# Patient Record
Sex: Male | Born: 1950 | Hispanic: No | Marital: Married | Smoking: Former smoker
Health system: Southern US, Community
[De-identification: ages and names within clinical notes are randomized; demographics above are authoritative.]

## PROBLEM LIST (undated history)

## (undated) DIAGNOSIS — E782 Mixed hyperlipidemia: Secondary | ICD-10-CM

## (undated) DIAGNOSIS — K219 Gastro-esophageal reflux disease without esophagitis: Secondary | ICD-10-CM

## (undated) DIAGNOSIS — T782XXA Anaphylactic shock, unspecified, initial encounter: Secondary | ICD-10-CM

## (undated) DIAGNOSIS — M1990 Unspecified osteoarthritis, unspecified site: Secondary | ICD-10-CM

## (undated) DIAGNOSIS — D841 Defects in the complement system: Secondary | ICD-10-CM

## (undated) DIAGNOSIS — R931 Abnormal findings on diagnostic imaging of heart and coronary circulation: Secondary | ICD-10-CM

## (undated) DIAGNOSIS — K4090 Unilateral inguinal hernia, without obstruction or gangrene, not specified as recurrent: Secondary | ICD-10-CM

## (undated) DIAGNOSIS — T63481A Toxic effect of venom of other arthropod, accidental (unintentional), initial encounter: Secondary | ICD-10-CM

## (undated) DIAGNOSIS — R351 Nocturia: Secondary | ICD-10-CM

## (undated) HISTORY — PX: NO PAST SURGERIES: SHX2092

---

## 2007-01-12 ENCOUNTER — Emergency Department (HOSPITAL_COMMUNITY): Admission: EM | Admit: 2007-01-12 | Discharge: 2007-01-12 | Payer: Self-pay | Admitting: Emergency Medicine

## 2015-12-01 ENCOUNTER — Emergency Department (HOSPITAL_BASED_OUTPATIENT_CLINIC_OR_DEPARTMENT_OTHER): Payer: BC Managed Care – PPO

## 2015-12-01 ENCOUNTER — Observation Stay (HOSPITAL_BASED_OUTPATIENT_CLINIC_OR_DEPARTMENT_OTHER)
Admission: EM | Admit: 2015-12-01 | Discharge: 2015-12-02 | Discharge status: Against Medical Advice | Payer: BC Managed Care – PPO | Attending: Internal Medicine | Admitting: Internal Medicine

## 2015-12-01 ENCOUNTER — Encounter (HOSPITAL_BASED_OUTPATIENT_CLINIC_OR_DEPARTMENT_OTHER): Payer: Self-pay | Admitting: *Deleted

## 2015-12-01 DIAGNOSIS — Z87891 Personal history of nicotine dependence: Secondary | ICD-10-CM | POA: Insufficient documentation

## 2015-12-01 DIAGNOSIS — T783XXA Angioneurotic edema, initial encounter: Secondary | ICD-10-CM | POA: Diagnosis not present

## 2015-12-01 DIAGNOSIS — J0510 Acute epiglottitis without obstruction: Secondary | ICD-10-CM | POA: Diagnosis not present

## 2015-12-01 DIAGNOSIS — T7840XA Allergy, unspecified, initial encounter: Secondary | ICD-10-CM | POA: Diagnosis present

## 2015-12-01 LAB — CBC WITH DIFFERENTIAL/PLATELET
Basophils Absolute: 0 10*3/uL (ref 0.0–0.1)
Basophils Relative: 0 %
EOS PCT: 6 %
Eosinophils Absolute: 0.5 10*3/uL (ref 0.0–0.7)
HCT: 45.1 % (ref 39.0–52.0)
HEMOGLOBIN: 15.5 g/dL (ref 13.0–17.0)
LYMPHS ABS: 3.9 10*3/uL (ref 0.7–4.0)
LYMPHS PCT: 44 %
MCH: 30.5 pg (ref 26.0–34.0)
MCHC: 34.4 g/dL (ref 30.0–36.0)
MCV: 88.8 fL (ref 78.0–100.0)
MONOS PCT: 9 %
Monocytes Absolute: 0.8 10*3/uL (ref 0.1–1.0)
Neutro Abs: 3.7 10*3/uL (ref 1.7–7.7)
Neutrophils Relative %: 41 %
PLATELETS: 148 10*3/uL — AB (ref 150–400)
RBC: 5.08 MIL/uL (ref 4.22–5.81)
RDW: 12.8 % (ref 11.5–15.5)
WBC: 8.9 10*3/uL (ref 4.0–10.5)

## 2015-12-01 LAB — BASIC METABOLIC PANEL
Anion gap: 9 (ref 5–15)
BUN: 17 mg/dL (ref 6–20)
CHLORIDE: 103 mmol/L (ref 101–111)
CO2: 29 mmol/L (ref 22–32)
Calcium: 9 mg/dL (ref 8.9–10.3)
Creatinine, Ser: 0.96 mg/dL (ref 0.61–1.24)
GFR calc Af Amer: >60 mL/min (ref >60)
GFR calc non Af Amer: >60 mL/min (ref >60)
GLUCOSE: 116 mg/dL — AB (ref 65–99)
POTASSIUM: 3.5 mmol/L (ref 3.5–5.1)
Sodium: 141 mmol/L (ref 135–145)

## 2015-12-01 LAB — MRSA PCR SCREENING: MRSA by PCR: NEGATIVE

## 2015-12-01 MED ORDER — CLINDAMYCIN PHOSPHATE 600 MG/50ML IV SOLN
600.0000 mg | Freq: Once | INTRAVENOUS | Status: AC
  Administered 2015-12-01: 600 mg via INTRAVENOUS
  Filled 2015-12-01: qty 50

## 2015-12-01 MED ORDER — DIPHENHYDRAMINE HCL 50 MG/ML IJ SOLN
25.0000 mg | Freq: Two times a day (BID) | INTRAMUSCULAR | Status: DC
  Administered 2015-12-01 – 2015-12-02 (×3): 25 mg via INTRAVENOUS
  Filled 2015-12-01 (×3): qty 1

## 2015-12-01 MED ORDER — FAMOTIDINE IN NACL 20-0.9 MG/50ML-% IV SOLN
20.0000 mg | Freq: Once | INTRAVENOUS | Status: AC
  Administered 2015-12-01: 20 mg via INTRAVENOUS
  Filled 2015-12-01: qty 50

## 2015-12-01 MED ORDER — FAMOTIDINE IN NACL 20-0.9 MG/50ML-% IV SOLN
20.0000 mg | INTRAVENOUS | Status: DC
  Administered 2015-12-01 – 2015-12-02 (×2): 20 mg via INTRAVENOUS
  Filled 2015-12-01 (×2): qty 50

## 2015-12-01 MED ORDER — DIPHENHYDRAMINE HCL 50 MG/ML IJ SOLN
25.0000 mg | Freq: Once | INTRAMUSCULAR | Status: AC
  Administered 2015-12-01: 25 mg via INTRAVENOUS
  Filled 2015-12-01: qty 1

## 2015-12-01 MED ORDER — ACETAMINOPHEN 325 MG PO TABS: 650.0000 mg | ORAL_TABLET | Freq: Four times a day (QID) | ORAL | Status: DC | PRN

## 2015-12-01 MED ORDER — SODIUM CHLORIDE 0.9 % IV BOLUS (SEPSIS)
500.0000 mL | Freq: Once | INTRAVENOUS | Status: AC
  Administered 2015-12-01: 500 mL via INTRAVENOUS

## 2015-12-01 MED ORDER — METHYLPREDNISOLONE SODIUM SUCC 125 MG IJ SOLR
125.0000 mg | Freq: Once | INTRAMUSCULAR | Status: AC
  Administered 2015-12-01: 125 mg via INTRAVENOUS
  Filled 2015-12-01: qty 2

## 2015-12-01 MED ORDER — SODIUM CHLORIDE 0.9 % IV SOLN
INTRAVENOUS | Status: DC
  Administered 2015-12-01 – 2015-12-02 (×2): via INTRAVENOUS

## 2015-12-01 MED ORDER — RISAQUAD PO CAPS
2.0000 | ORAL_CAPSULE | Freq: Every day | ORAL | Status: DC
  Administered 2015-12-01: 2 via ORAL
  Filled 2015-12-01 (×3): qty 2

## 2015-12-01 MED ORDER — METHYLPREDNISOLONE SODIUM SUCC 125 MG IJ SOLR
60.0000 mg | Freq: Three times a day (TID) | INTRAMUSCULAR | Status: DC
Start: 2015-12-01 — End: 2015-12-01

## 2015-12-01 MED ORDER — METHYLPREDNISOLONE SODIUM SUCC 125 MG IJ SOLR: 80.0000 mg | Freq: Four times a day (QID) | INTRAMUSCULAR | Status: DC

## 2015-12-01 MED ORDER — SODIUM CHLORIDE 0.9 % IV SOLN
INTRAVENOUS | Status: DC
  Administered 2015-12-01: 1000 mL via INTRAVENOUS

## 2015-12-01 MED ORDER — DEXAMETHASONE SODIUM PHOSPHATE 10 MG/ML IJ SOLN
10.0000 mg | Freq: Three times a day (TID) | INTRAMUSCULAR | Status: DC
  Administered 2015-12-01 – 2015-12-02 (×3): 10 mg via INTRAVENOUS
  Filled 2015-12-01 (×4): qty 1

## 2015-12-01 MED ORDER — ACETAMINOPHEN 650 MG RE SUPP: 650.0000 mg | Freq: Four times a day (QID) | RECTAL | Status: DC | PRN

## 2015-12-01 MED ORDER — CLINDAMYCIN PHOSPHATE 600 MG/50ML IV SOLN
600.0000 mg | Freq: Four times a day (QID) | INTRAVENOUS | Status: DC
  Administered 2015-12-01 – 2015-12-02 (×4): 600 mg via INTRAVENOUS
  Filled 2015-12-01 (×6): qty 50

## 2015-12-01 MED ORDER — CLINDAMYCIN PHOSPHATE 300 MG/2ML IJ SOLN
INTRAMUSCULAR | Status: AC
  Filled 2015-12-01: qty 4

## 2015-12-01 NOTE — ED Provider Notes (Signed)
CSN: [id number]     Arrival date & time 12/01/15  0150 History   First MD Initiated Contact with Patient 12/01/15 0151     Chief Complaint  Patient presents with  . Allergic Reaction     (Consider location/radiation/quality/duration/timing/severity/associated sxs/prior Treatment) Patient is a 65 y.o. male presenting with allergic reaction. The history is provided by the patient.  Allergic Reaction Presenting symptoms: no itching, no rash and no wheezing   Severity:  Moderate Prior allergic episodes:  Unable to specify (has happened in the past and has an epi pen though all allergy testing in past was negative) Context: no animal exposure, no food allergies, no grass, no medications, no new detergents/soaps and no nuts   Relieved by:  Nothing Worsened by:  Nothing tried Ineffective treatments:  Epinephrine Symptoms of throat swelling that started at 3 pm.  Continued throughout the evening and used son's epi pen at 1 am and then not improved so came to the ED.  No rashes on the skin has had this before and states has had allergy testing that was negative. Cant' elicit an inciting event or foot.  No f/c/r.  No rashes on the skin.  No nasal congestion no sore throat.  Has never been seen in the ED for this reaction [place] and was given RX for epi pen by PMD.  His epi pen is expired.    History reviewed. No pertinent past medical history. History reviewed. No pertinent past surgical history. History reviewed. No pertinent family history. Social History  Substance Use Topics  . Smoking status: Former Research scientist (life sciences)  . Smokeless tobacco: None  . Alcohol Use: Yes    Review of Systems  Constitutional: Negative for fever.  HENT: Negative for congestion, drooling, postnasal drip, sore throat and voice change.   Respiratory: Negative for shortness of breath, wheezing and stridor.   Skin: Negative for itching and rash.  All other systems reviewed and are  negative.     Allergies  Review of patient's allergies indicates no known allergies.  Home Medications   Prior to Admission medications   Not on File   BP 151/83 mmHg  Pulse 53  Temp(Src) 97.7 F (36.5 C) (Oral)  Resp 8  Ht 5\' 6"  (1.676 m)  Wt 157 lb (71.215 kg)  BMI 25.35 kg/m2  SpO2 99% Physical Exam  Constitutional: He is oriented to person, place, and time. He appears well-developed and well-nourished. No distress.  HENT:  Head: Normocephalic and atraumatic.  Mouth/Throat: Oropharynx is clear and moist.  No swelling of the lips tongue or uvula  Eyes: Conjunctivae are normal. Pupils are equal, round, and reactive to light.  Neck: Normal range of motion. Neck supple. No tracheal deviation present.  Phonation intact  Cardiovascular: Normal rate, regular rhythm and intact distal pulses.   Pulmonary/Chest: Effort normal and breath sounds normal. No stridor. No respiratory distress. He has no wheezes. He has no rales.  Abdominal: Soft. Bowel sounds are normal. There is no tenderness. There is no rebound and no guarding.  Musculoskeletal: Normal range of motion.  Neurological: He is alert and oriented to person, place, and time.  Skin: Skin is warm and dry. He is not diaphoretic.  Psychiatric: He has a normal mood and affect.    ED Course  Procedures (including critical care time) Labs Review Labs Reviewed  CBC WITH DIFFERENTIAL/PLATELET - Abnormal; Notable for the following:    Platelets 148 (*)    All other  components within normal limits  BASIC METABOLIC PANEL - Abnormal; Notable for the following:    Glucose, Bld 116 (*)    All other components within normal limits    Imaging Review No results found. I have personally reviewed and evaluated these images and lab results as part of my medical decision-making.   EKG Interpretation None      MDM   Final diagnoses:  None    Medications  methylPREDNISolone sodium succinate (SOLU-MEDROL) 125 mg/2 mL  injection 125 mg (125 mg Intravenous Given 12/01/15 0217)  famotidine (PEPCID) IVPB 20 mg premix (0 mg Intravenous Stopped 12/01/15 0245)  diphenhydrAMINE (BENADRYL) injection 25 mg (25 mg Intravenous Given 12/01/15 0217)  sodium chloride 0.9 % bolus 500 mL (0 mLs Intravenous Stopped 12/01/15 0245)    Patient being observed in the Ed following Epi use   No swelling on repeat exams but patient states he is not any better then stating this will take a couple of days to pass as this is what has happened on previous episodes.  Then states he will need antibiotics to improve this as he has needed this in the past.    EDP states that we do not give antibiotics for allergic reactions.  As there are no signs of allergic reaction at this time EDP planned Xray to ensure no signs of infection.   Patient then refused Xray  EDP spoke with patient and wife at length about Xray.  Now consenting stating he needs the antibiotics for the tightness  439 Case d/w Dr. Benjamine Mola by phone.  Admit to medicine for observation and IV clindamycin.  Will see in consult.    500 am   Results for orders placed or performed during the hospital encounter of 12/01/15  CBC with Differential/Platelet  Result Value Ref Range   WBC 8.9 4.0 - 10.5 K/uL   RBC 5.08 4.22 - 5.81 MIL/uL   Hemoglobin 15.5 13.0 - 17.0 g/dL   HCT 45.1 39.0 - 52.0 %   MCV 88.8 78.0 - 100.0 fL   MCH 30.5 26.0 - 34.0 pg   MCHC 34.4 30.0 - 36.0 g/dL   RDW 12.8 11.5 - 15.5 %   Platelets 148 (L) 150 - 400 K/uL   Neutrophils Relative % 41 %   Neutro Abs 3.7 1.7 - 7.7 K/uL   Lymphocytes Relative 44 %   Lymphs Abs 3.9 0.7 - 4.0 K/uL   Monocytes Relative 9 %   Monocytes Absolute 0.8 0.1 - 1.0 K/uL   Eosinophils Relative 6 %   Eosinophils Absolute 0.5 0.0 - 0.7 K/uL   Basophils Relative 0 %   Basophils Absolute 0.0 0.0 - 0.1 K/uL  Basic metabolic panel  Result Value Ref Range   Sodium 141 135 - 145 mmol/L   Potassium 3.5 3.5 - 5.1 mmol/L   Chloride 103 101  - 111 mmol/L   CO2 29 22 - 32 mmol/L   Glucose, Bld 116 (H) 65 - 99 mg/dL   BUN 17 6 - 20 mg/dL   Creatinine, Ser 0.96 0.61 - 1.24 mg/dL   Calcium 9.0 8.9 - 10.3 mg/dL   GFR calc non Af Amer >60 >60 mL/min   GFR calc Af Amer >60 >60 mL/min   Anion gap 9 5 - 15   Dg Neck Soft Tissue  12/01/2015  CLINICAL DATA:  Neck swelling for 8 hours. Possible allergic reaction. EXAM: NECK SOFT TISSUES - 1+ VIEW COMPARISON:  None. FINDINGS: Soft tissue swelling is demonstrated  in the epiglottis and area epiglottic folds with mild prevertebral and submental soft tissue swelling. Changes likely due to inflammation. No soft tissue gas collections. No foreign bodies identified. Airways appear patent. Degenerative changes in the cervical spine. IMPRESSION: Soft tissue swelling in the epiglottis, aryepiglottic folds, submental soft tissues, and prevertebral soft tissues, likely inflammatory. Electronically Signed   By: Lucienne Capers M.D.   On: 12/01/2015 04:26    Medications  clindamycin (CLEOCIN) IVPB 600 mg (not administered)  clindamycin (CLEOCIN) 300 MG/2ML injection (not administered)  0.9 %  sodium chloride infusion (not administered)  methylPREDNISolone sodium succinate (SOLU-MEDROL) 125 mg/2 mL injection 125 mg (125 mg Intravenous Given 12/01/15 0217)  famotidine (PEPCID) IVPB 20 mg premix (0 mg Intravenous Stopped 12/01/15 0245)  diphenhydrAMINE (BENADRYL) injection 25 mg (25 mg Intravenous Given 12/01/15 0217)  sodium chloride 0.9 % bolus 500 mL (0 mLs Intravenous Stopped 12/01/15 0245)    MDM Reviewed: previous chart, nursing note and vitals (normal temp and O2 saturation) Interpretation: labs and x-ray (no elevation of WBC platelets slighlty low at 148  XRay with epiglottitis by me) Total time providing critical care: > 105 minutes. This excludes time spent performing separately reportable procedures and services. Consults: admitting MD ([facility]- [facility])  Reexamined no stridor.  No swelling in the oral  airway  CRITICAL CARE Performed by: Carlisle Beers Total critical care time: 120 minutes Critical care time was exclusive of separately billable procedures and treating other patients. Critical care was necessary to treat or prevent imminent or life-threatening deterioration. Critical care was time spent personally by me on the following activities: development of treatment plan with patient and/or surrogate as well as nursing, discussions with consultants, evaluation of patient's response to treatment, examination of patient, obtaining history from patient or surrogate, ordering and performing treatments and interventions, ordering and review of laboratory studies, ordering and review of radiographic studies, pulse oximetry and re-evaluation of patient's condition.    Veatrice Kells, MD 12/01/15 [phone]

## 2015-12-01 NOTE — Progress Notes (Signed)
This is a no charge note  Transfer from  [facility] per Dr. Randal Buba  65 year old lady with past medical history of allergy, who presents with the allergic reaction yesterday, developed swelling throat. Patient used epi pen at home without improvement. No stridor per EDP.  Blood pressure 151/83, heart rate 53, respiration rate 8, and saturation 99%, WBCs 8.9, electrolytes and renal function okay. X-ray of neck showed soft tissue swelling in the epiglottis, aryepiglottic folds, submental soft tissues, and prevertebral soft tissues, likely inflammatory.  Pt is accepted to SDU. Patient was treated with Benadryl, Pepcid and Solu-Medrol and started clindamycin. ENT. Dr. Benjamine Mola was consulted by EDP. Please let Dr. Benjamine Mola at pt's arrival to floor. Asked Dr. Dorina Hoyer to evaluate pt again at time just before being transferred. She agreed to do so.  Ivor Costa, MD  [facility] Pager [phone]  If 7PM-7AM, please contact night-coverage [email or web address] Password [email or web address] 12/01/2015, 6:06 AM

## 2015-12-01 NOTE — ED Notes (Addendum)
States had allergic reaction onset yesterday afternoon, this am flet like throat was swelling  Used epi pen prior to coming to er  No distress noted

## 2015-12-01 NOTE — ED Notes (Signed)
Started have a "reaction " yesterday pm , throat was swelling,   Took ibu and tums,   Used epi ped about 30 minutes pta

## 2015-12-01 NOTE — Consult Note (Signed)
Reason for Consult: Throat swelling, angioedema Referring Physician: April Palumbo, MD  HPI:  Shawn Scott is an 65 y.o. male who presented to the [place] ED last evening with swelling to throat and difficulty swallowing. Symptoms began around 3pm after eating. He used his epipen last evening prior to arriving in ED with little to no relief. Pt states he has had similar episodes in the past starting 20 years ago, but has never required hospitalization. He has undergone allergy testing that was inconclusive. Last episode was in 07/2015. He is not on any BP meds.  At [facility] ED, pt received IV solumedrol, benadryl, pepcid and clindamycin. Xray of neck showed soft tissue swelling. Pt has been admitted for further evaluation and treatment. Pt denies recent fever, chills, headache, dizziness, sob, chest pain, abdominal pain, n/v/d.   History reviewed. No pertinent past medical history.  History reviewed. No pertinent past surgical history.  History reviewed. No pertinent family history.  Social History:  reports that he has quit smoking. He does not have any smokeless tobacco history on file. He reports that he drinks alcohol. He reports that he does not use illicit drugs.  Allergies: No Known Allergies  Prior to Admission medications   Not on File    Medications:  I have reviewed the patient's current medications. Scheduled: . acidophilus  2 capsule Oral Daily  . clindamycin      . clindamycin (CLEOCIN) IV  600 mg Intravenous 4 times per day  . diphenhydrAMINE  25 mg Intravenous Q12H  . famotidine (PEPCID) IV  20 mg Intravenous Q24H  . methylPREDNISolone (SOLU-MEDROL) injection  60 mg Intravenous 3 times per day   HLK:[id number] **OR** acetaminophen  Results for orders placed or performed during the hospital encounter of 12/01/15 (from the past 48 hour(s))  CBC with Differential/Platelet     Status: Abnormal   Collection Time: 12/01/15  3:45 AM  Result Value Ref  Range   WBC 8.9 4.0 - 10.5 K/uL   RBC 5.08 4.22 - 5.81 MIL/uL   Hemoglobin 15.5 13.0 - 17.0 g/dL   HCT 45.1 39.0 - 52.0 %   MCV 88.8 78.0 - 100.0 fL   MCH 30.5 26.0 - 34.0 pg   MCHC 34.4 30.0 - 36.0 g/dL   RDW 12.8 11.5 - 15.5 %   Platelets 148 (L) 150 - 400 K/uL   Neutrophils Relative % 41 %   Neutro Abs 3.7 1.7 - 7.7 K/uL   Lymphocytes Relative 44 %   Lymphs Abs 3.9 0.7 - 4.0 K/uL   Monocytes Relative 9 %   Monocytes Absolute 0.8 0.1 - 1.0 K/uL   Eosinophils Relative 6 %   Eosinophils Absolute 0.5 0.0 - 0.7 K/uL   Basophils Relative 0 %   Basophils Absolute 0.0 0.0 - 0.1 K/uL  Basic metabolic panel     Status: Abnormal   Collection Time: 12/01/15  3:45 AM  Result Value Ref Range   Sodium 141 135 - 145 mmol/L   Potassium 3.5 3.5 - 5.1 mmol/L   Chloride 103 101 - 111 mmol/L   CO2 29 22 - 32 mmol/L   Glucose, Bld 116 (H) 65 - 99 mg/dL   BUN 17 6 - 20 mg/dL   Creatinine, Ser 0.96 0.61 - 1.24 mg/dL   Calcium 9.0 8.9 - 10.3 mg/dL   GFR calc non Af Amer >60 >60 mL/min   GFR calc Af Amer >60 >60 mL/min    Comment: (NOTE) The eGFR has  been calculated using the CKD EPI equation. This calculation has not been validated in all clinical situations. eGFR's persistently <60 mL/min signify possible Chronic Kidney Disease.    Anion gap 9 5 - 15    Dg Neck Soft Tissue  12/01/2015  CLINICAL DATA:  Neck swelling for 8 hours. Possible allergic reaction. EXAM: NECK SOFT TISSUES - 1+ VIEW COMPARISON:  None. FINDINGS: Soft tissue swelling is demonstrated in the epiglottis and area epiglottic folds with mild prevertebral and submental soft tissue swelling. Changes likely due to inflammation. No soft tissue gas collections. No foreign bodies identified. Airways appear patent. Degenerative changes in the cervical spine. IMPRESSION: Soft tissue swelling in the epiglottis, aryepiglottic folds, submental soft tissues, and prevertebral soft tissues, likely inflammatory. Electronically Signed   By:  Lucienne Capers M.D.   On: 12/01/2015 04:26   Review of Systems  No Fever-chills, No Headache, No changes with Vision or hearing, No Chest pain, Cough or Shortness of Breath, No Abdominal pain, No Nausea or Vomiting, Bowel movements are regular, No Blood in stool or Urine, No dysuria, No new skin rashes or bruises, No new joints pains-aches,  No new weakness, tingling, numbness in any extremity, No recent weight gain or loss, All other Review of Systems were negative.  Blood pressure 138/81, pulse 61, temperature 97.8 F (36.6 C), temperature source Oral, resp. rate 10, height '5\' 6"'  (1.676 m), weight 69 kg (152 lb 1.9 oz), SpO2 98 %. Physical Exam  Constitutional: He is oriented to person, place, and time. He appears well-developed and well-nourished. No distress.  Head: Normocephalic and atraumatic.  Ears: Normal auricles and EACs. Mouth/Throat: Oropharynx is clear and moist.  No swelling of the lips, tongue, or uvula  Eyes: Conjunctivae are normal. Pupils are equal, round, and reactive to light.  Neck: Normal range of motion. Neck supple. No tracheal deviation present.  Phonation intact. Cardiovascular: Normal rate, regular rhythm and intact distal pulses.  Pulmonary/Chest: Effort normal and breath sounds normal. No stridor. No respiratory distress. He has no wheezes. He has no rales.  Musculoskeletal: Normal range of motion.  Neurological: He is alert and oriented to person, place, and time.  Skin: Skin is warm and dry. He is not diaphoretic.  Psychiatric: He has a normal mood and affect.   Procedure:  Flexible Fiberoptic Laryngoscopy Anesthesia: Topical oxymetazoline and lidocaine Indication: Persistent throat pain. Description: Risks, benefits, and alternatives of flexible endoscopy were explained to the patient. Specific mention was made of the risk of throat numbness with difficulty swallowing, possible bleeding from the nose and mouth, and pain from the procedure.   The patient gave oral consent to proceed.  The nasal cavities were decongested and anesthetised with a combination of oxymetazoline and 4% lidocaine solution.  The flexible scope was inserted into the left nasal cavity and advanced towards the nasopharynx.  Visualized mucosa over the turbinates and septum were normal.  The nasopharynx was clear.  Oropharyngeal walls were symmetric and mobile without lesion, mass, or edema.  Hypopharynx was also without  lesion or edema. Arytenoid mucosa and epiglottis are severely edematous.  A small but patent glottic opening is noted. No significant erythema or purulent drainage.  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 Continue IV steroid (consider decadron) and empiric clindamycin. Will need close monitoring. Will repeat laryngoscopy tomorrow.  Makyiah Lie,SUI W 12/01/2015, 9:34 AM

## 2015-12-01 NOTE — ED Notes (Signed)
Auscultated patient's laryngeal throat, all WNL. Patient has no stridor and not in distress. Notified physician of assessment.

## 2015-12-01 NOTE — H&P (Signed)
[facility] History and Physical                                                                                    Shawn Scott, is a 65 y.o. male  MRN: [id number]   DOB - [birth year]  Admit Date - 12/01/2015  Outpatient Primary MD  [facility] (uncertain of provider)   With History of -  Hx of idiopathic angiodema    in for   Chief Complaint  Patient presents with  . Allergic Reaction     HPI  Shawn Scott  is a 65 y.o. male with no known medical problems presented to ED last evening with swelling to throat and difficulty swallowing. Symptoms began around 3pm after eating. He used his epi pen last evening prior to arriving in ED with little to no relief.  Pt states he has had similar episodes in the past starting 20 years ago, but has never required hospitalization. He has undergone allergy testing that was inconclusive. Last episode was in 07/2015.    In ED, pt received IV solumedrol, benadryl, pepcid and clindamycin. Xray of neck showed soft tissue swelling. Pt has been admitted for further evaluation and treatment. Dr Benjamine Mola will see pt in consultation. Pt denies recent fever, chills, headache, dizziness, sob, chest pain, abdominal pain, n/v/d.   Review of Systems   In addition to the HPI above,  No Fever-chills, No Headache, No changes with Vision or hearing, No Chest pain, Cough or Shortness of Breath, No Abdominal pain, No Nausea or Vomiting, Bowel movements are regular, No Blood in stool or Urine, No dysuria, No new skin rashes or bruises, No new joints pains-aches,  No new weakness, tingling, numbness in any extremity, No recent weight gain or loss, A full 10 point Review of Systems was done, except as stated above, all other Review of Systems were negative.  Social History  Moved to states from [place] 40+ years ago. Married. Owns a Nurse, learning disability. Occasional tobacco and etoh use.    Family History History reviewed. No pertinent family  history.  Prior to Admission medications   Not on File    No Known Allergies  Physical Exam  Vitals  Blood pressure 146/77, pulse 65, temperature 97.8 F (36.6 C), temperature source Oral, resp. rate 12, height 5\' 6"  (1.676 m), weight 69 kg (152 lb 1.9 oz), SpO2 100 %.   General:  lying in bed in NAD,   Psych:  Normal affect and insight, Not Suicidal or Homicidal, Awake Alert, Oriented X 3.  Neuro:   Moves all extremities x 4. No focal m/s deficits on exam  ENT:  Ears and Eyes appear Normal, Conjunctivae clear, PER. Moist oral mucosa. Swelling and erythema noted to uvula and tonsilar region L>R  Neck:  Supple, mild lymphadenopathy to left cervical nodes  Respiratory:  Symmetrical chest wall movement, Good air movement bilaterally, CTAB.  Cardiac:  RRR, No Murmurs, no LE edema noted, no JVD.    Abdomen:  Positive bowel sounds, Soft, Non tender, Non distended,  No masses appreciated  Skin:  No Cyanosis, Normal Skin Turgor, No Skin Rash or Bruise.  Extremities:  Able to  move all 4. 5/5 strength in each,  no effusions.  Data Review  CBC  Recent Labs Lab 12/01/15 0345  WBC 8.9  HGB 15.5  HCT 45.1  PLT 148*  MCV 88.8  MCH 30.5  MCHC 34.4  RDW 12.8  LYMPHSABS 3.9  MONOABS 0.8  EOSABS 0.5  BASOSABS 0.0    Chemistries   Recent Labs Lab 12/01/15 0345  NA 141  K 3.5  CL 103  CO2 29  GLUCOSE 116*  BUN 17  CREATININE 0.96  CALCIUM 9.0    estimated creatinine clearance is 71.1 mL/min (by C-G formula based on Cr of 0.96).   Imaging results:   Dg Neck Soft Tissue  12/01/2015  CLINICAL DATA:  Neck swelling for 8 hours. Possible allergic reaction. EXAM: NECK SOFT TISSUES - 1+ VIEW COMPARISON:  None. FINDINGS: Soft tissue swelling is demonstrated in the epiglottis and area epiglottic folds with mild prevertebral and submental soft tissue swelling. Changes likely due to inflammation. No soft tissue gas collections. No foreign bodies identified. Airways appear  patent. Degenerative changes in the cervical spine. IMPRESSION: Soft tissue swelling in the epiglottis, aryepiglottic folds, submental soft tissues, and prevertebral soft tissues, likely inflammatory. Electronically Signed   By: Lucienne Capers M.D.   On: 12/01/2015 04:26     Assessment & Plan  Principal Problem:   Epiglottitis  Epiglotitis, acute idiopathic -With hx of same. No home meds. Cannot rule out infx etiology so will continue clindamycin at this time per ENT recommendations. No recent fever, normal WBC -will likely need more comprehensive allergy testing as outpt. -scheduled IV solumedrol, benadryl and pepcid for now -appreciate ENT assisstance.    DVT Prophylaxis: SCDs  AM Labs Ordered, also please review Full Orders  Family Communication:  None available on admit  Code Status:  full  Condition:  guarded  Time spent in minutes : [place], NP on 12/01/2015 at 7:46 AM Between 7am to 7pm - Pager - [phone] After 7pm go to [email or web address] - password [email or web address]  And look for the night coverage person covering me after hours  [facility]

## 2015-12-02 DIAGNOSIS — J0510 Acute epiglottitis without obstruction: Principal | ICD-10-CM

## 2015-12-02 LAB — BASIC METABOLIC PANEL
ANION GAP: 9 (ref 5–15)
BUN: 16 mg/dL (ref 6–20)
CHLORIDE: 108 mmol/L (ref 101–111)
CO2: 25 mmol/L (ref 22–32)
Calcium: 8.9 mg/dL (ref 8.9–10.3)
Creatinine, Ser: 0.91 mg/dL (ref 0.61–1.24)
GLUCOSE: 168 mg/dL — AB (ref 65–99)
POTASSIUM: 3.9 mmol/L (ref 3.5–5.1)
Sodium: 142 mmol/L (ref 135–145)

## 2015-12-02 LAB — CBC
HEMATOCRIT: 42.5 % (ref 39.0–52.0)
HEMOGLOBIN: 14.4 g/dL (ref 13.0–17.0)
MCH: 30.1 pg (ref 26.0–34.0)
MCHC: 33.9 g/dL (ref 30.0–36.0)
MCV: 88.7 fL (ref 78.0–100.0)
Platelets: 138 10*3/uL — ABNORMAL LOW (ref 150–400)
RBC: 4.79 MIL/uL (ref 4.22–5.81)
RDW: 12.6 % (ref 11.5–15.5)
WBC: 11.7 10*3/uL — AB (ref 4.0–10.5)

## 2015-12-02 NOTE — Progress Notes (Signed)
Patient signed leaving hospital against medical advice form after hearing what leaving against medical advice means. Patient stated, "If I don't know what time the doctor will see me I don't want to stay here." Patient requested to see the MD first thing this morning, MD was paged but seeing critical patients first. Patient was not satisfied that the MD couldn't see him immediately upon request and asked to leave. Patient stated his wife will be here to pick him up. Patient thanked nursing staff for taking care of him.

## 2015-12-02 NOTE — Discharge Summary (Signed)
Patient left AMA, no charge

## 2015-12-02 NOTE — Care Management Note (Signed)
Case Management Note  Patient Details  Name: RYEN VAZGUEZ MRN: [id number] Date of Birth: 1951-03-07  Subjective/Objective:  Patient left AMA.                   Action/Plan:   Expected Discharge Date:                  Expected Discharge Plan:  Home/Self Care  In-House Referral:     Discharge planning Services  CM Consult  Post Acute Care Choice:    Choice offered to:     DME Arranged:    DME Agency:     HH Arranged:    [place] Agency:     Status of Service:  Completed, signed off  Medicare Important Message Given:    Date Medicare IM Given:    Medicare IM give by:    Date Additional Medicare IM Given:    Additional Medicare Important Message give by:     If discussed at [place] of Stay Meetings, dates discussed:    Additional Comments:  Zenon Mayo, RN 12/02/2015, 11:28 AM

## 2017-08-21 DIAGNOSIS — M7912 Myalgia of auxiliary muscles, head and neck: Secondary | ICD-10-CM | POA: Diagnosis not present

## 2017-08-21 DIAGNOSIS — M5416 Radiculopathy, lumbar region: Secondary | ICD-10-CM | POA: Diagnosis not present

## 2017-08-21 DIAGNOSIS — M5413 Radiculopathy, cervicothoracic region: Secondary | ICD-10-CM | POA: Diagnosis not present

## 2017-08-21 DIAGNOSIS — M5412 Radiculopathy, cervical region: Secondary | ICD-10-CM | POA: Diagnosis not present

## 2017-08-26 DIAGNOSIS — M5416 Radiculopathy, lumbar region: Secondary | ICD-10-CM | POA: Diagnosis not present

## 2017-08-26 DIAGNOSIS — M5413 Radiculopathy, cervicothoracic region: Secondary | ICD-10-CM | POA: Diagnosis not present

## 2017-08-26 DIAGNOSIS — M5412 Radiculopathy, cervical region: Secondary | ICD-10-CM | POA: Diagnosis not present

## 2017-08-26 DIAGNOSIS — M7912 Myalgia of auxiliary muscles, head and neck: Secondary | ICD-10-CM | POA: Diagnosis not present

## 2017-08-29 DIAGNOSIS — M5416 Radiculopathy, lumbar region: Secondary | ICD-10-CM | POA: Diagnosis not present

## 2017-08-29 DIAGNOSIS — M7912 Myalgia of auxiliary muscles, head and neck: Secondary | ICD-10-CM | POA: Diagnosis not present

## 2017-08-29 DIAGNOSIS — M5412 Radiculopathy, cervical region: Secondary | ICD-10-CM | POA: Diagnosis not present

## 2017-08-29 DIAGNOSIS — M5413 Radiculopathy, cervicothoracic region: Secondary | ICD-10-CM | POA: Diagnosis not present

## 2017-09-04 DIAGNOSIS — M5412 Radiculopathy, cervical region: Secondary | ICD-10-CM | POA: Diagnosis not present

## 2017-09-04 DIAGNOSIS — M5413 Radiculopathy, cervicothoracic region: Secondary | ICD-10-CM | POA: Diagnosis not present

## 2017-09-04 DIAGNOSIS — M5416 Radiculopathy, lumbar region: Secondary | ICD-10-CM | POA: Diagnosis not present

## 2017-09-04 DIAGNOSIS — M7912 Myalgia of auxiliary muscles, head and neck: Secondary | ICD-10-CM | POA: Diagnosis not present

## 2017-09-09 DIAGNOSIS — M5412 Radiculopathy, cervical region: Secondary | ICD-10-CM | POA: Diagnosis not present

## 2017-09-09 DIAGNOSIS — M5416 Radiculopathy, lumbar region: Secondary | ICD-10-CM | POA: Diagnosis not present

## 2017-09-09 DIAGNOSIS — M7912 Myalgia of auxiliary muscles, head and neck: Secondary | ICD-10-CM | POA: Diagnosis not present

## 2017-09-09 DIAGNOSIS — M5413 Radiculopathy, cervicothoracic region: Secondary | ICD-10-CM | POA: Diagnosis not present

## 2017-09-12 DIAGNOSIS — M5416 Radiculopathy, lumbar region: Secondary | ICD-10-CM | POA: Diagnosis not present

## 2017-09-12 DIAGNOSIS — M5412 Radiculopathy, cervical region: Secondary | ICD-10-CM | POA: Diagnosis not present

## 2017-09-12 DIAGNOSIS — M7912 Myalgia of auxiliary muscles, head and neck: Secondary | ICD-10-CM | POA: Diagnosis not present

## 2017-09-12 DIAGNOSIS — M5413 Radiculopathy, cervicothoracic region: Secondary | ICD-10-CM | POA: Diagnosis not present

## 2017-09-16 DIAGNOSIS — M5412 Radiculopathy, cervical region: Secondary | ICD-10-CM | POA: Diagnosis not present

## 2017-09-16 DIAGNOSIS — M5416 Radiculopathy, lumbar region: Secondary | ICD-10-CM | POA: Diagnosis not present

## 2017-09-16 DIAGNOSIS — M7912 Myalgia of auxiliary muscles, head and neck: Secondary | ICD-10-CM | POA: Diagnosis not present

## 2017-09-16 DIAGNOSIS — M5413 Radiculopathy, cervicothoracic region: Secondary | ICD-10-CM | POA: Diagnosis not present

## 2017-09-19 DIAGNOSIS — M5413 Radiculopathy, cervicothoracic region: Secondary | ICD-10-CM | POA: Diagnosis not present

## 2017-09-19 DIAGNOSIS — M5416 Radiculopathy, lumbar region: Secondary | ICD-10-CM | POA: Diagnosis not present

## 2017-09-19 DIAGNOSIS — M7912 Myalgia of auxiliary muscles, head and neck: Secondary | ICD-10-CM | POA: Diagnosis not present

## 2017-09-19 DIAGNOSIS — M5412 Radiculopathy, cervical region: Secondary | ICD-10-CM | POA: Diagnosis not present

## 2017-09-26 DIAGNOSIS — M5413 Radiculopathy, cervicothoracic region: Secondary | ICD-10-CM | POA: Diagnosis not present

## 2017-09-26 DIAGNOSIS — M7912 Myalgia of auxiliary muscles, head and neck: Secondary | ICD-10-CM | POA: Diagnosis not present

## 2017-09-26 DIAGNOSIS — M5416 Radiculopathy, lumbar region: Secondary | ICD-10-CM | POA: Diagnosis not present

## 2017-09-26 DIAGNOSIS — M5412 Radiculopathy, cervical region: Secondary | ICD-10-CM | POA: Diagnosis not present

## 2017-09-30 DIAGNOSIS — M5413 Radiculopathy, cervicothoracic region: Secondary | ICD-10-CM | POA: Diagnosis not present

## 2017-09-30 DIAGNOSIS — M5416 Radiculopathy, lumbar region: Secondary | ICD-10-CM | POA: Diagnosis not present

## 2017-09-30 DIAGNOSIS — M7912 Myalgia of auxiliary muscles, head and neck: Secondary | ICD-10-CM | POA: Diagnosis not present

## 2017-09-30 DIAGNOSIS — M5412 Radiculopathy, cervical region: Secondary | ICD-10-CM | POA: Diagnosis not present

## 2017-11-27 ENCOUNTER — Other Ambulatory Visit: Payer: Self-pay | Admitting: Family Medicine

## 2017-11-27 DIAGNOSIS — F419 Anxiety disorder, unspecified: Secondary | ICD-10-CM | POA: Diagnosis not present

## 2017-11-27 DIAGNOSIS — Z136 Encounter for screening for cardiovascular disorders: Secondary | ICD-10-CM | POA: Diagnosis not present

## 2017-11-27 DIAGNOSIS — Z0000 Encounter for general adult medical examination without abnormal findings: Secondary | ICD-10-CM | POA: Diagnosis not present

## 2017-11-27 DIAGNOSIS — M542 Cervicalgia: Secondary | ICD-10-CM | POA: Diagnosis not present

## 2017-11-27 DIAGNOSIS — Z23 Encounter for immunization: Secondary | ICD-10-CM | POA: Diagnosis not present

## 2017-11-27 DIAGNOSIS — Z79899 Other long term (current) drug therapy: Secondary | ICD-10-CM | POA: Diagnosis not present

## 2017-11-27 DIAGNOSIS — Z13220 Encounter for screening for lipoid disorders: Secondary | ICD-10-CM | POA: Diagnosis not present

## 2017-11-27 DIAGNOSIS — Z87898 Personal history of other specified conditions: Secondary | ICD-10-CM | POA: Diagnosis not present

## 2017-11-27 DIAGNOSIS — Z125 Encounter for screening for malignant neoplasm of prostate: Secondary | ICD-10-CM | POA: Diagnosis not present

## 2017-11-27 DIAGNOSIS — Z87891 Personal history of nicotine dependence: Secondary | ICD-10-CM | POA: Diagnosis not present

## 2017-11-29 DIAGNOSIS — L821 Other seborrheic keratosis: Secondary | ICD-10-CM | POA: Diagnosis not present

## 2017-12-09 ENCOUNTER — Ambulatory Visit: Payer: BC Managed Care – PPO

## 2017-12-23 ENCOUNTER — Ambulatory Visit
Admission: RE | Admit: 2017-12-23 | Discharge: 2017-12-23 | Disposition: A | Discharge status: Home / Self Care | Payer: PPO | Source: Ambulatory Visit | Attending: Family Medicine | Admitting: Family Medicine

## 2017-12-23 DIAGNOSIS — Z136 Encounter for screening for cardiovascular disorders: Secondary | ICD-10-CM

## 2017-12-23 DIAGNOSIS — Z87891 Personal history of nicotine dependence: Secondary | ICD-10-CM | POA: Diagnosis not present

## 2018-05-27 DIAGNOSIS — H524 Presbyopia: Secondary | ICD-10-CM | POA: Diagnosis not present

## 2018-07-11 DIAGNOSIS — J209 Acute bronchitis, unspecified: Secondary | ICD-10-CM | POA: Diagnosis not present

## 2018-07-24 IMAGING — US US ABDOMINAL AORTA SCREENING AAA
1 series · 14 of 14 positions shown · non-contrast
Comparison: None.

CLINICAL DATA: Screening for abdominal aortic aneurysm.
66-year-old, first time exam, male with smoking history

EXAM:
ULTRASOUND OF ABDOMINAL AORTA
TECHNIQUE: Ultrasound examination of the abdominal aorta was performed to
evaluate for abdominal aortic aneurysm.

[Series 1: us abdominal aorta screening aaa · 0.22mm/px · 14 of 14 slices shown]
[im 1/14]
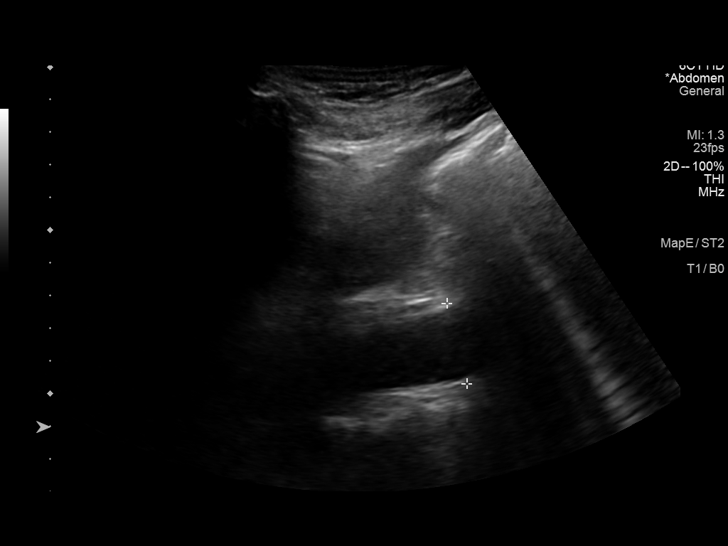
[im 2/14]
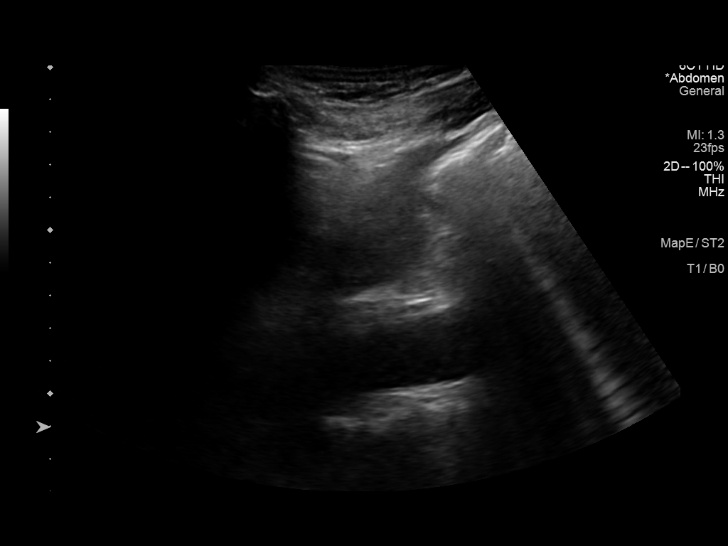
[im 3/14]
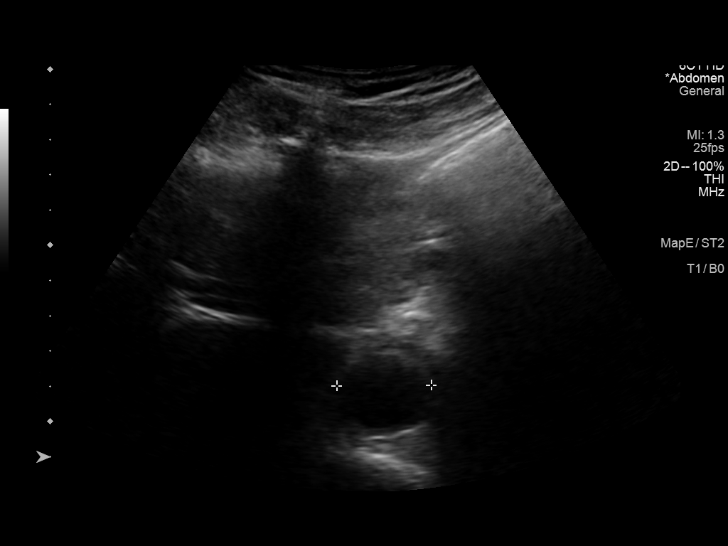
[im 4/14]
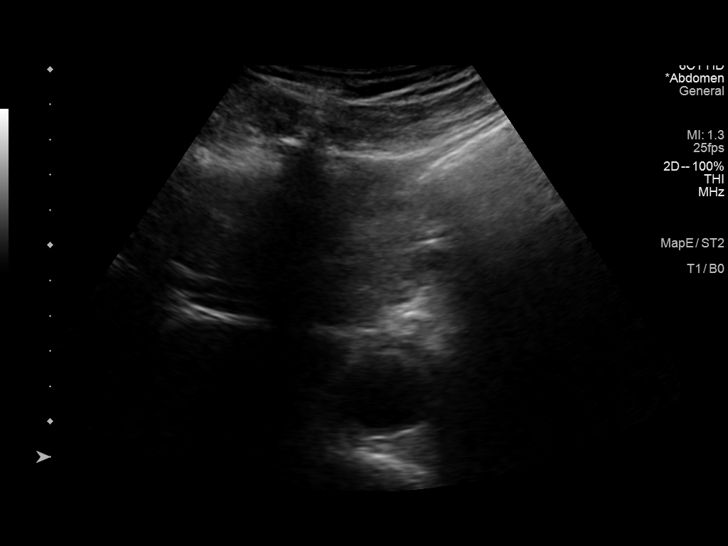
[im 5/14]
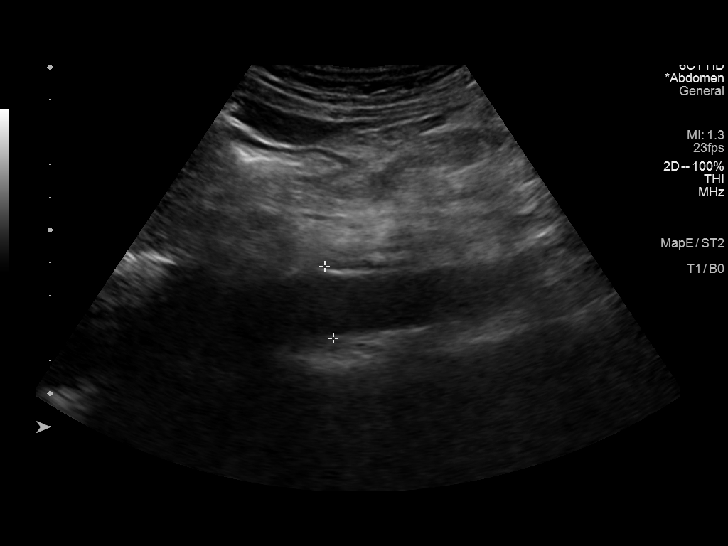
[im 6/14]
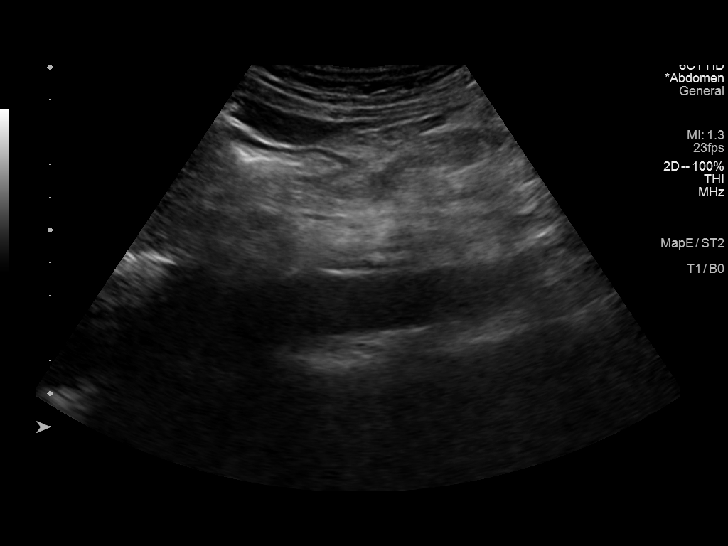
[im 7/14]
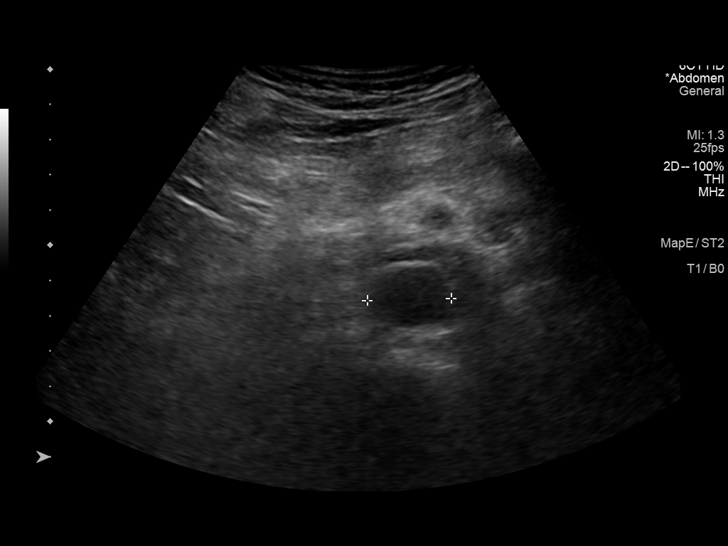
[im 8/14]
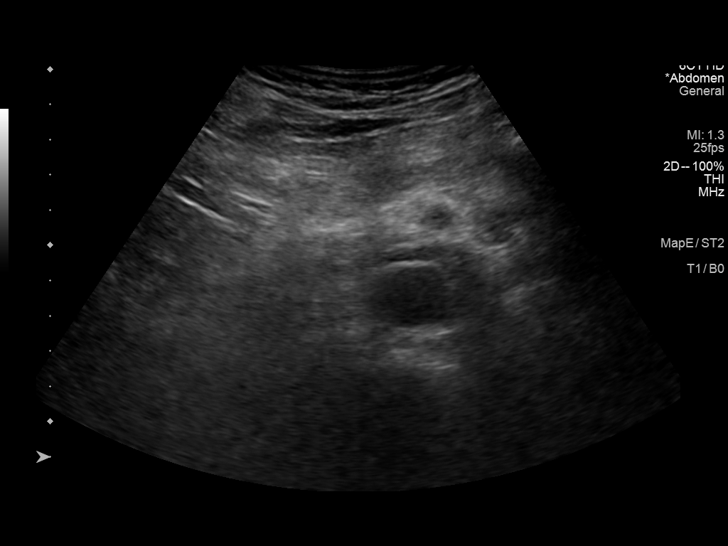
[im 9/14]
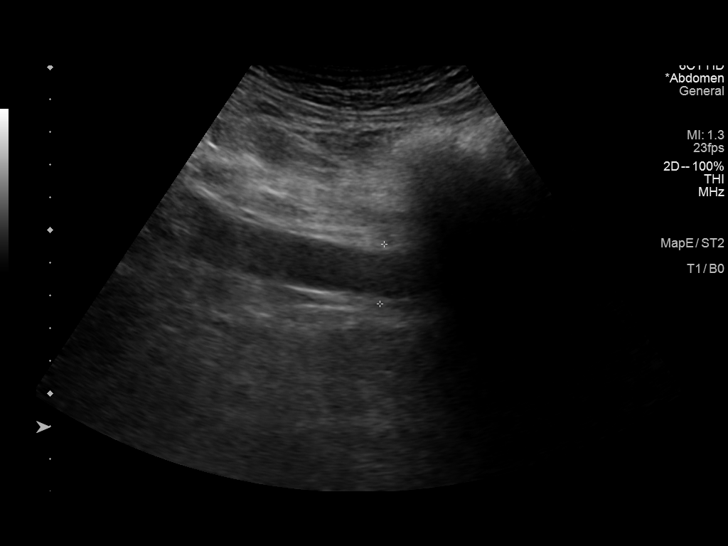
[im 10/14]
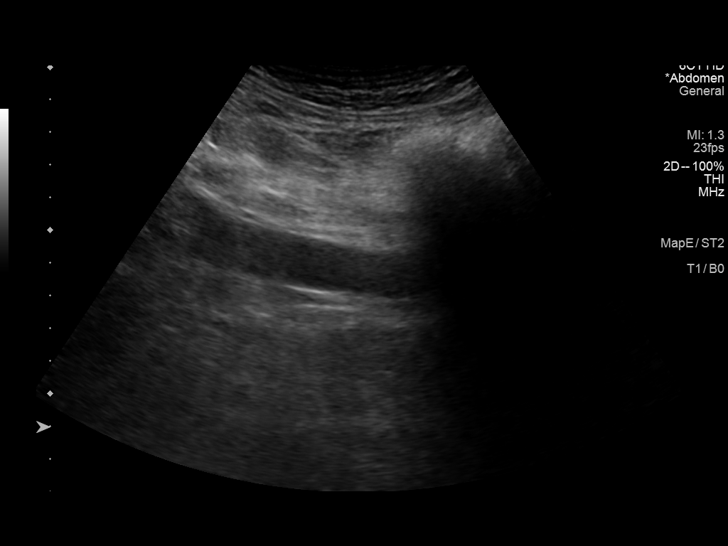
[im 11/14]
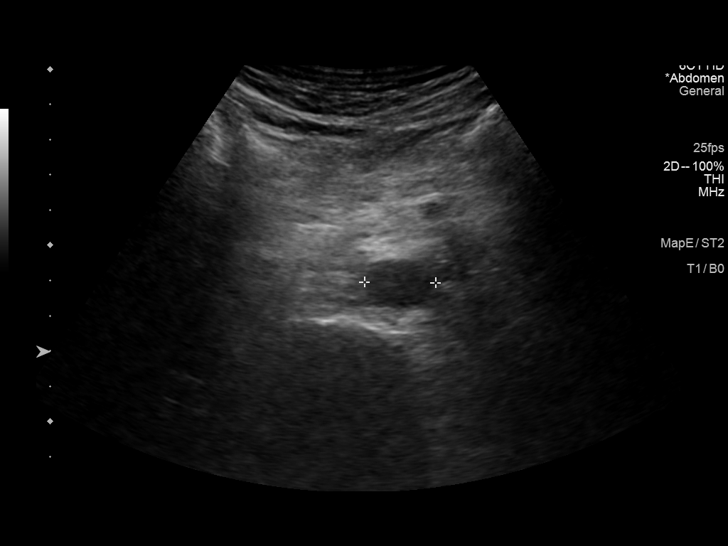
[im 12/14]
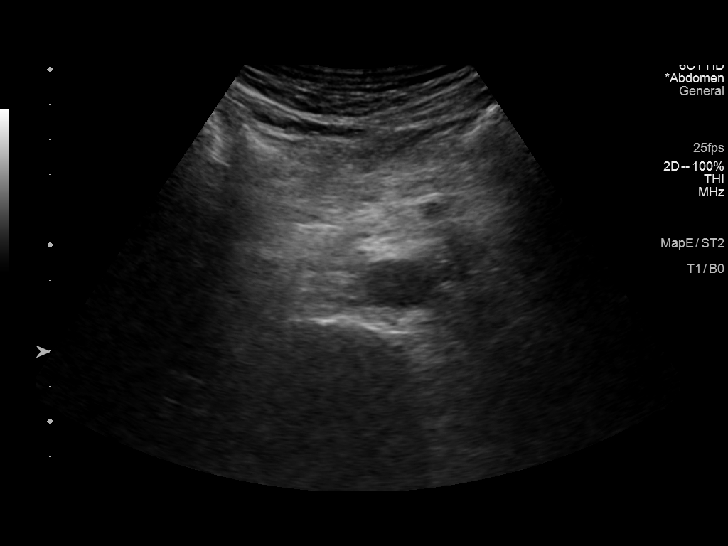
[im 13/14]
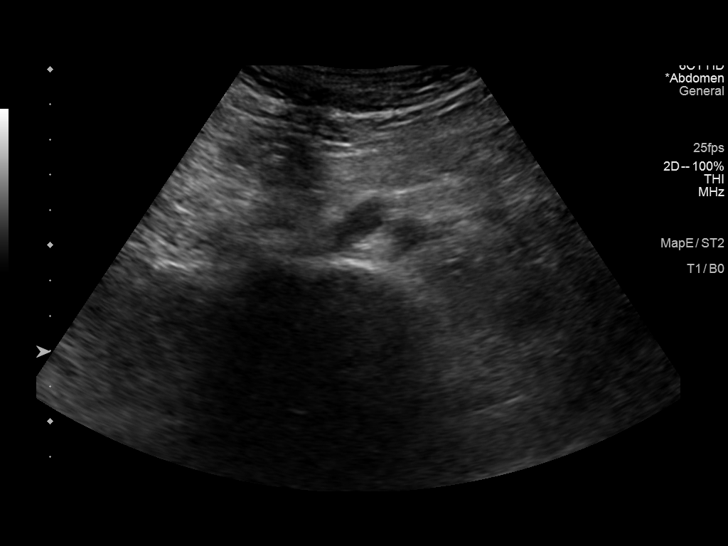
[im 14/14]
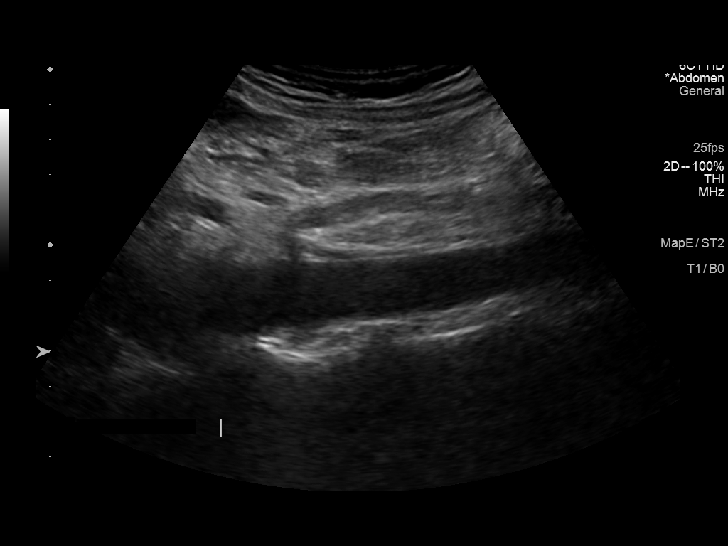

[14 of 14 positions shown; findings below may reference images not displayed]

FINDINGS: Abdominal aortic measurements as follows:

Proximal:  2.7  cm

Mid: 2.4 cm. Adjacent hypoechoic crescent ventral and lateral to the
aorta on the transverse image is best attributed to a nonvascular
structure with shadowing.

Distal:  1.8 cm

Probable atheromatous wall thickening.
IMPRESSION: Ectatic abdominal aorta at risk for aneurysm development. Recommend
followup by ultrasound in 5 years. This recommendation follows ACR
consensus guidelines: White Paper of the ACR Incidental Findings
Committee II on Vascular Findings. [HOSPITAL] 5539;

## 2018-11-17 DIAGNOSIS — T783XXA Angioneurotic edema, initial encounter: Secondary | ICD-10-CM | POA: Diagnosis not present

## 2018-12-29 DIAGNOSIS — T783XXA Angioneurotic edema, initial encounter: Secondary | ICD-10-CM | POA: Diagnosis not present

## 2018-12-29 DIAGNOSIS — I77811 Abdominal aortic ectasia: Secondary | ICD-10-CM | POA: Diagnosis not present

## 2018-12-29 DIAGNOSIS — C9000 Multiple myeloma not having achieved remission: Secondary | ICD-10-CM | POA: Diagnosis not present

## 2018-12-29 DIAGNOSIS — Z79899 Other long term (current) drug therapy: Secondary | ICD-10-CM | POA: Diagnosis not present

## 2018-12-29 DIAGNOSIS — Z1211 Encounter for screening for malignant neoplasm of colon: Secondary | ICD-10-CM | POA: Diagnosis not present

## 2018-12-29 DIAGNOSIS — Z23 Encounter for immunization: Secondary | ICD-10-CM | POA: Diagnosis not present

## 2018-12-29 DIAGNOSIS — E7800 Pure hypercholesterolemia, unspecified: Secondary | ICD-10-CM | POA: Diagnosis not present

## 2018-12-29 DIAGNOSIS — Z0001 Encounter for general adult medical examination with abnormal findings: Secondary | ICD-10-CM | POA: Diagnosis not present

## 2018-12-29 DIAGNOSIS — Z125 Encounter for screening for malignant neoplasm of prostate: Secondary | ICD-10-CM | POA: Diagnosis not present

## 2019-12-03 DIAGNOSIS — D841 Defects in the complement system: Secondary | ICD-10-CM | POA: Diagnosis not present

## 2020-01-13 DIAGNOSIS — E7800 Pure hypercholesterolemia, unspecified: Secondary | ICD-10-CM | POA: Diagnosis not present

## 2020-01-13 DIAGNOSIS — Z125 Encounter for screening for malignant neoplasm of prostate: Secondary | ICD-10-CM | POA: Diagnosis not present

## 2020-01-13 DIAGNOSIS — T783XXA Angioneurotic edema, initial encounter: Secondary | ICD-10-CM | POA: Diagnosis not present

## 2020-01-13 DIAGNOSIS — Z79899 Other long term (current) drug therapy: Secondary | ICD-10-CM | POA: Diagnosis not present

## 2020-01-13 DIAGNOSIS — Z0001 Encounter for general adult medical examination with abnormal findings: Secondary | ICD-10-CM | POA: Diagnosis not present

## 2020-01-13 DIAGNOSIS — F419 Anxiety disorder, unspecified: Secondary | ICD-10-CM | POA: Diagnosis not present

## 2020-01-13 DIAGNOSIS — I77811 Abdominal aortic ectasia: Secondary | ICD-10-CM | POA: Diagnosis not present

## 2020-01-25 DIAGNOSIS — Z0001 Encounter for general adult medical examination with abnormal findings: Secondary | ICD-10-CM | POA: Diagnosis not present

## 2020-06-24 DIAGNOSIS — R030 Elevated blood-pressure reading, without diagnosis of hypertension: Secondary | ICD-10-CM | POA: Diagnosis not present

## 2020-06-24 DIAGNOSIS — R7309 Other abnormal glucose: Secondary | ICD-10-CM | POA: Diagnosis not present

## 2020-06-24 DIAGNOSIS — R42 Dizziness and giddiness: Secondary | ICD-10-CM | POA: Diagnosis not present

## 2020-06-27 DIAGNOSIS — D582 Other hemoglobinopathies: Secondary | ICD-10-CM | POA: Diagnosis not present

## 2020-06-27 DIAGNOSIS — R7309 Other abnormal glucose: Secondary | ICD-10-CM | POA: Diagnosis not present

## 2021-01-23 DIAGNOSIS — E7800 Pure hypercholesterolemia, unspecified: Secondary | ICD-10-CM | POA: Diagnosis not present

## 2021-01-23 DIAGNOSIS — Z0001 Encounter for general adult medical examination with abnormal findings: Secondary | ICD-10-CM | POA: Diagnosis not present

## 2021-01-23 DIAGNOSIS — I77811 Abdominal aortic ectasia: Secondary | ICD-10-CM | POA: Diagnosis not present

## 2021-01-23 DIAGNOSIS — F419 Anxiety disorder, unspecified: Secondary | ICD-10-CM | POA: Diagnosis not present

## 2021-01-23 DIAGNOSIS — Z79899 Other long term (current) drug therapy: Secondary | ICD-10-CM | POA: Diagnosis not present

## 2021-01-30 DIAGNOSIS — Z0001 Encounter for general adult medical examination with abnormal findings: Secondary | ICD-10-CM | POA: Diagnosis not present

## 2021-12-14 DIAGNOSIS — T63481A Toxic effect of venom of other arthropod, accidental (unintentional), initial encounter: Secondary | ICD-10-CM | POA: Diagnosis not present

## 2021-12-14 DIAGNOSIS — D841 Defects in the complement system: Secondary | ICD-10-CM | POA: Diagnosis not present

## 2022-02-08 ENCOUNTER — Other Ambulatory Visit: Payer: Self-pay | Admitting: Family Medicine

## 2022-02-08 ENCOUNTER — Other Ambulatory Visit (HOSPITAL_BASED_OUTPATIENT_CLINIC_OR_DEPARTMENT_OTHER): Payer: Self-pay | Admitting: Family Medicine

## 2022-02-08 DIAGNOSIS — E7800 Pure hypercholesterolemia, unspecified: Secondary | ICD-10-CM | POA: Diagnosis not present

## 2022-02-08 DIAGNOSIS — Z79899 Other long term (current) drug therapy: Secondary | ICD-10-CM | POA: Diagnosis not present

## 2022-02-08 DIAGNOSIS — Z0001 Encounter for general adult medical examination with abnormal findings: Secondary | ICD-10-CM | POA: Diagnosis not present

## 2022-02-08 DIAGNOSIS — F419 Anxiety disorder, unspecified: Secondary | ICD-10-CM | POA: Diagnosis not present

## 2022-02-08 DIAGNOSIS — R7309 Other abnormal glucose: Secondary | ICD-10-CM | POA: Diagnosis not present

## 2022-02-08 DIAGNOSIS — Z125 Encounter for screening for malignant neoplasm of prostate: Secondary | ICD-10-CM | POA: Diagnosis not present

## 2022-02-08 DIAGNOSIS — K4090 Unilateral inguinal hernia, without obstruction or gangrene, not specified as recurrent: Secondary | ICD-10-CM | POA: Diagnosis not present

## 2022-02-08 DIAGNOSIS — D841 Defects in the complement system: Secondary | ICD-10-CM | POA: Diagnosis not present

## 2022-02-08 DIAGNOSIS — I77811 Abdominal aortic ectasia: Secondary | ICD-10-CM | POA: Diagnosis not present

## 2022-02-12 DIAGNOSIS — Z0001 Encounter for general adult medical examination with abnormal findings: Secondary | ICD-10-CM | POA: Diagnosis not present

## 2022-02-14 ENCOUNTER — Telehealth (HOSPITAL_BASED_OUTPATIENT_CLINIC_OR_DEPARTMENT_OTHER): Payer: Self-pay

## 2022-02-15 ENCOUNTER — Telehealth (HOSPITAL_BASED_OUTPATIENT_CLINIC_OR_DEPARTMENT_OTHER): Payer: Self-pay

## 2022-04-26 ENCOUNTER — Ambulatory Visit
Admission: RE | Admit: 2022-04-26 | Discharge: 2022-04-26 | Disposition: A | Discharge status: Home / Self Care | Payer: PPO | Source: Ambulatory Visit | Attending: Family Medicine | Admitting: Family Medicine

## 2022-04-26 DIAGNOSIS — E7800 Pure hypercholesterolemia, unspecified: Secondary | ICD-10-CM

## 2022-04-26 DIAGNOSIS — I700 Atherosclerosis of aorta: Secondary | ICD-10-CM | POA: Diagnosis not present

## 2022-05-10 DIAGNOSIS — E7800 Pure hypercholesterolemia, unspecified: Secondary | ICD-10-CM | POA: Diagnosis not present

## 2022-05-10 DIAGNOSIS — I2510 Atherosclerotic heart disease of native coronary artery without angina pectoris: Secondary | ICD-10-CM | POA: Diagnosis not present

## 2022-06-10 NOTE — Progress Notes (Unsigned)
Cardiology Office Note:   Date:  06/12/2022  NAME:  Shawn Scott    MRN: [id number] DOB:  July 22, 1951   PCP:  Lujean Amel, MD  Cardiologist:  None  Electrophysiologist:  None   Referring MD: Lujean Amel, MD   Chief Complaint  Patient presents with   Coronary Artery Disease         History of Present Illness:   Shawn Scott is a 71 y.o. male with a hx of CAC, HLD who is being seen today for the evaluation of CAC at the request of Koirala, Dibas, MD. he presents for follow-up of calcium scoring.  Underwent testing with his primary care physician.  Value in the 63rd percentile based on [facility] percentiles.  Denies chest pain or trouble breathing.  EKG demonstrates sinus rhythm with no acute ischemic changes.  He has had issues with statins in the past.  Currently on Crestor 5 mg daily.  We discussed taking this every other day and adding Zetia.  Would aim for an LDL cholesterol around 70.  He is a former smoker.  No alcohol or drug use.  He reports he is semiretired.  He does not have a history of hypertension.  There is no strong family history of heart disease.  He overall is without complaints.  His CV examination is normal.  He is married.  He has 2 children.  He has several grandchildren.  Problem List CAC -coronary calcium score 290 (63rd percentile) 2. HLD -T chol 182, TG 84, HDL 39,  LDL 127  Past Medical History: History reviewed. No pertinent past medical history.  Past Surgical History: History reviewed. No pertinent surgical history.  Current Medications: Current Meds  Medication Sig   ALPRAZolam (XANAX) 0.25 MG tablet Take 0.25 mg by mouth daily as needed.   EPINEPHrine 0.3 mg/0.3 mL IJ SOAJ injection Inject into the muscle.   ibuprofen (ADVIL,MOTRIN) 200 MG tablet Take 200 mg by mouth every 6 (six) hours as needed for moderate pain.   icatibant Acetate (FIRAZYR) 30 MG/3ML injection Inject '30mg'$  subcutaneously once as needed & as directed for acute swelling/  angioedema. (max 3/24 hr)   rosuvastatin (CRESTOR) 5 MG tablet Take 5 mg by mouth daily.     Allergies:    Patient has no known allergies.   Social History: Social History   Socioeconomic History   Marital status: Married    Spouse name: Not on file   Number of children: 2   Years of education: Not on file   Highest education level: Not on file  Occupational History   Occupation: Retired  Tobacco Use   Smoking status: Former   Smokeless tobacco: Not on file  Substance and Sexual Activity   Alcohol use: Yes   Drug use: No   Sexual activity: Not on file  Other Topics Concern   Not on file  Social History Narrative   Not on file   Social Determinants of Health   Financial Resource Strain: Not on file  Food Insecurity: Not on file  Transportation Needs: Not on file  Physical Activity: Not on file  Stress: Not on file  Social Connections: Not on file     Family History: The patient's family history includes Heart disease in his sister.  ROS:   All other ROS reviewed and negative. Pertinent positives noted in the HPI.     EKGs/Labs/Other Studies Reviewed:   The following studies were personally reviewed by me today:  EKG:  EKG is  ordered today.  The ekg ordered today demonstrates normal sinus rhythm heart rate 59, no acute ischemic changes or evidence of infarction, and was personally reviewed by me.   Recent Labs: No results found for requested labs within last 365 days.   Recent Lipid Panel No results found for: "CHOL", "TRIG", "HDL", "CHOLHDL", "VLDL", "LDLCALC", "LDLDIRECT"  Physical Exam:   VS:  BP 120/70   Pulse (!) 59   Ht '5\' 6"'$  (1.676 m)   Wt 158 lb 1.6 oz (71.7 kg)   SpO2 98%   BMI 25.52 kg/m    Wt Readings from Last 3 Encounters:  06/12/22 158 lb 1.6 oz (71.7 kg)  12/01/15 152 lb 1.9 oz (69 kg)    General: Well nourished, well developed, in no acute distress Head: Atraumatic, normal size  Eyes: PEERLA, EOMI  Neck: Supple, no JVD Endocrine:  No thryomegaly Cardiac: Normal S1, S2; RRR; no murmurs, rubs, or gallops Lungs: Clear to auscultation bilaterally, no wheezing, rhonchi or rales  Abd: Soft, nontender, no hepatomegaly  Ext: No edema, pulses 2+ Musculoskeletal: No deformities, BUE and BLE strength normal and equal Skin: Warm and dry, no rashes   Neuro: Alert and oriented to person, place, time, and situation, CNII-XII grossly intact, no focal deficits  Psych: Normal mood and affect   ASSESSMENT:   Shawn Scott is a 71 y.o. male who presents for the following: 1. 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 Mixed hyperlipidemia     PLAN:   1. 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 Mixed hyperlipidemia -Coronary calcium score 293 which is 63rd percentile.  EKG demonstrates sinus rhythm with no acute ischemic changes.  He has no symptoms concerning for angina.  He can exercise without limitations.  His CV examination is normal.  Would recommend aggressive secondary prevention.  He has had troubles with Crestor.  We will take 5 mg every other day.  We will add Zetia 10 mg daily.  I would like for him to come back in 3 months.  We will recheck lipids at that time.  Would aim for an LDL cholesterol goal of 70.  He will see me yearly.  No need for stress test given lack of symptoms.  He will let us know if he develops chest symptoms but he is currently having none.  Disposition: Return in about 3 months (around 09/11/2022).  Medication Adjustments/Labs and Tests Ordered: Current medicines are reviewed at length with the patient today.  Concerns regarding medicines are outlined above.  No orders of the defined types were placed in this encounter.  No orders of the defined types were placed in this encounter.   Patient Instructions  Medication Instructions:  START Crestor 5 mg every other day  START Zetia 10 mg daily   *If you need a refill on your cardiac medications before your  next appointment, please call your pharmacy*   Lab Work: LIPID before appointment in 3 months (no lab appointment needed)  If you have labs (blood work) drawn today and your tests are completely normal, you will receive your results only by: [place] (if you have MyChart) OR A paper copy in the mail If you have any lab test that is abnormal or we need to change your treatment, we will call you to review the results.   Follow-Up: At [facility], you and your health needs are our priority.  As part of our continuing mission to  provide you with exceptional heart care, we have created designated Provider Care Teams.  These Care Teams include your primary Cardiologist (physician) and Advanced Practice Providers (APPs -  Physician Assistants and Nurse Practitioners) who all work together to provide you with the care you need, when you need it.  We recommend signing up for the patient portal called "MyChart".  Sign up information is provided on this After Visit Summary.  MyChart is used to connect with patients for Virtual Visits (Telemedicine).  Patients are able to view lab/test results, encounter notes, upcoming appointments, etc.  Non-urgent messages can be sent to your provider as well.   To learn more about what you can do with MyChart, go to [email or web address].    Your next appointment:   3 month(s)  The format for your next appointment:   In Person  Provider:   Sande Rives, PA-C, Almyra Deforest, PA-C, or Diona Browner, NP    Then, Eleonore Chiquito, MD will plan to see you again in 12 month(s).           Signed, Addison Naegeli. Audie Box, MD, [place]  [address], [place], [place] [address] [phone]  06/12/2022 9:40 AM

## 2022-06-12 ENCOUNTER — Ambulatory Visit: Payer: PPO | Attending: Cardiovascular Disease | Admitting: Cardiovascular Disease

## 2022-06-12 ENCOUNTER — Encounter: Payer: Self-pay | Admitting: Cardiovascular Disease

## 2022-06-12 VITALS — BP 120/70 | HR 59 | Ht 66.0 in | Wt 158.1 lb

## 2022-06-12 DIAGNOSIS — R931 Abnormal findings on diagnostic imaging of heart and coronary circulation: Secondary | ICD-10-CM | POA: Diagnosis not present

## 2022-06-12 DIAGNOSIS — E782 Mixed hyperlipidemia: Secondary | ICD-10-CM | POA: Diagnosis not present

## 2022-06-12 MED ORDER — EZETIMIBE 10 MG PO TABS: 10.0000 mg | ORAL_TABLET | Freq: Every day | ORAL | 3 refills | Status: DC

## 2022-06-12 MED ORDER — ROSUVASTATIN CALCIUM 5 MG PO TABS: 5.0000 mg | ORAL_TABLET | ORAL | 3 refills | Status: AC

## 2022-06-12 NOTE — Patient Instructions (Signed)
Medication Instructions:  START Crestor 5 mg every other day  START Zetia 10 mg daily   *If you need a refill on your cardiac medications before your next appointment, please call your pharmacy*   Lab Work: LIPID before appointment in 3 months (no lab appointment needed)  If you have labs (blood work) drawn today and your tests are completely normal, you will receive your results only by: [place] (if you have MyChart) OR A paper copy in the mail If you have any lab test that is abnormal or we need to change your treatment, we will call you to review the results.   Follow-Up: At [facility], you and your health needs are our priority.  As part of our continuing mission to provide you with exceptional heart care, we have created designated Provider Care Teams.  These Care Teams include your primary Cardiologist (physician) and Advanced Practice Providers (APPs -  Physician Assistants and Nurse Practitioners) who all work together to provide you with the care you need, when you need it.  We recommend signing up for the patient portal called "MyChart".  Sign up information is provided on this After Visit Summary.  MyChart is used to connect with patients for Virtual Visits (Telemedicine).  Patients are able to view lab/test results, encounter notes, upcoming appointments, etc.  Non-urgent messages can be sent to your provider as well.   To learn more about what you can do with MyChart, go to [email or web address].    Your next appointment:   3 month(s)  The format for your next appointment:   In Person  Provider:   Sande Rives, PA-C, Almyra Deforest, PA-C, or Diona Browner, NP    Then, Eleonore Chiquito, MD will plan to see you again in 12 month(s).

## 2022-10-16 DIAGNOSIS — E782 Mixed hyperlipidemia: Secondary | ICD-10-CM | POA: Diagnosis not present

## 2022-10-16 LAB — LIPID PANEL
Chol/HDL Ratio: 2.7 ratio (ref 0.0–5.0)
Cholesterol, Total: 120 mg/dL (ref 100–199)
HDL: 44 mg/dL (ref >39)
LDL Chol Calc (NIH): 64 mg/dL (ref 0–99)
Triglycerides: 51 mg/dL (ref 0–149)
VLDL Cholesterol Cal: 12 mg/dL (ref 5–40)

## 2022-10-17 ENCOUNTER — Encounter: Payer: Self-pay | Admitting: *Deleted

## 2022-11-11 NOTE — Progress Notes (Deleted)
Cardiology Office Note:   Date:  11/11/2022  NAME:  Shawn Scott    MRN: [id number] DOB:  Oct 22, 1950   PCP:  Lujean Amel, MD  Cardiologist:  None  Electrophysiologist:  None   Referring MD: Lujean Amel, MD   No chief complaint on file. ***  History of Present Illness:   Shawn Scott is a 72 y.o. male with a hx of coronary calcium and HLD who presents for follow-up.    Problem List CAC -coronary calcium score 290 (63rd percentile) 2. HLD -T chol 120, HDL 44, LDL 64, TG 51  Past Medical History: No past medical history on file.  Past Surgical History: No past surgical history on file.  Current Medications: No outpatient medications have been marked as taking for the 11/13/22 encounter (Appointment) with O'Neal, Cassie Freer, MD.     Allergies:    Patient has no known allergies.   Social History: Social History   Socioeconomic History   Marital status: Married    Spouse name: Not on file   Number of children: 2   Years of education: Not on file   Highest education level: Not on file  Occupational History   Occupation: Retired  Tobacco Use   Smoking status: Former   Smokeless tobacco: Not on file  Substance and Sexual Activity   Alcohol use: Yes   Drug use: No   Sexual activity: Not on file  Other Topics Concern   Not on file  Social History Narrative   Not on file   Social Determinants of Health   Financial Resource Strain: Not on file  Food Insecurity: Not on file  Transportation Needs: Not on file  Physical Activity: Not on file  Stress: Not on file  Social Connections: Not on file     Family History: The patient's ***family history includes Heart disease in his sister.  ROS:   All other ROS reviewed and negative. Pertinent positives noted in the HPI.     EKGs/Labs/Other Studies Reviewed:   The following studies were personally reviewed by me today:  EKG:  EKG is *** ordered today.  The ekg ordered today demonstrates ***, and  was personally reviewed by me.   Recent Labs: No results found for requested labs within last 365 days.   Recent Lipid Panel    Component Value Date/Time   CHOL 120 10/16/2022 0843   TRIG 51 10/16/2022 0843   HDL 44 10/16/2022 0843   CHOLHDL 2.7 10/16/2022 0843   LDLCALC 64 10/16/2022 0843    Physical Exam:   VS:  There were no vitals taken for this visit.   Wt Readings from Last 3 Encounters:  06/12/22 158 lb 1.6 oz (71.7 kg)  12/01/15 152 lb 1.9 oz (69 kg)    General: Well nourished, well developed, in no acute distress Head: Atraumatic, normal size  Eyes: PEERLA, EOMI  Neck: Supple, no JVD Endocrine: No thryomegaly Cardiac: Normal S1, S2; RRR; no murmurs, rubs, or gallops Lungs: Clear to auscultation bilaterally, no wheezing, rhonchi or rales  Abd: Soft, nontender, no hepatomegaly  Ext: No edema, pulses 2+ Musculoskeletal: No deformities, BUE and BLE strength normal and equal Skin: Warm and dry, no rashes   Neuro: Alert and oriented to person, place, time, and situation, CNII-XII grossly intact, no focal deficits  Psych: Normal mood and affect   ASSESSMENT:   Shawn Scott is a 72 y.o. male who presents for the following: No diagnosis found.  PLAN:   There  are no diagnoses linked to this encounter.  {Are you ordering a CV Procedure (e.g. stress test, cath, DCCV, TEE, etc)?   Press F2        :[id number]  Disposition: No follow-ups on file.  Medication Adjustments/Labs and Tests Ordered: Current medicines are reviewed at length with the patient today.  Concerns regarding medicines are outlined above.  No orders of the defined types were placed in this encounter.  No orders of the defined types were placed in this encounter.   There are no Patient Instructions on file for this visit.   Time Spent with Patient: I have spent a total of *** minutes with patient reviewing hospital notes, telemetry, EKGs, labs and examining the patient as well as establishing an  assessment and plan that was discussed with the patient.  > 50% of time was spent in direct patient care.  Signed, Addison Naegeli. Audie Box, MD, [place]  [address], [place], [place] [address] [phone]  11/11/2022 9:12 AM

## 2022-11-13 ENCOUNTER — Ambulatory Visit: Payer: PPO | Attending: Cardiovascular Disease | Admitting: Cardiovascular Disease

## 2022-11-13 DIAGNOSIS — R931 Abnormal findings on diagnostic imaging of heart and coronary circulation: Secondary | ICD-10-CM

## 2022-11-13 DIAGNOSIS — E782 Mixed hyperlipidemia: Secondary | ICD-10-CM

## 2022-11-18 NOTE — Progress Notes (Unsigned)
Cardiology Office Note:   Date:  11/19/2022  NAME:  Shawn Scott    MRN: [id number] DOB:  02/27/1951   PCP:  Lujean Amel, MD  Cardiologist:  None  Electrophysiologist:  None   Referring MD: Lujean Amel, MD   Chief Complaint  Patient presents with   Follow-up        History of Present Illness:   Shawn Scott is a 72 y.o. male with a hx of coronary calcium who presents for follow-up.   He reports he was taking Crestor but stopped it.  He stopped it after his cholesterol level was at goal.  We discussed that this is likely at goal because of the Crestor.  He reports no chest pain or trouble breathing.  He reports he was having brain fog with Crestor.  He is exercising daily.  No major issues.  Seems to be quite stable.  We discussed continuing Crestor every other day versus Lipitor.  He would like to continue with Crestor for now.  If he continues to have brain fog should can consider switching to Lipitor.  Problem List CAC -coronary calcium score 290 (63rd percentile) 2. HLD -T chol 120, HDL 44, LDL 64, TG 51  Past Medical History: History reviewed. No pertinent past medical history.  Past Surgical History: History reviewed. No pertinent surgical history.  Current Medications: Current Meds  Medication Sig   ALPRAZolam (XANAX) 0.25 MG tablet Take 0.25 mg by mouth daily as needed.   EPINEPHrine 0.3 mg/0.3 mL IJ SOAJ injection Inject into the muscle.   ibuprofen (ADVIL,MOTRIN) 200 MG tablet Take 200 mg by mouth every 6 (six) hours as needed for moderate pain.   icatibant Acetate (FIRAZYR) 30 MG/3ML injection Inject '30mg'$  subcutaneously once as needed & as directed for acute swelling/ angioedema. (max 3/24 hr)   rosuvastatin (CRESTOR) 5 MG tablet Take 1 tablet (5 mg total) by mouth every other day.   [DISCONTINUED] ezetimibe (ZETIA) 10 MG tablet Take 1 tablet (10 mg total) by mouth daily.     Allergies:    Patient has no known allergies.   Social History: Social  History   Socioeconomic History   Marital status: Married    Spouse name: Not on file   Number of children: 2   Years of education: Not on file   Highest education level: Not on file  Occupational History   Occupation: Retired  Tobacco Use   Smoking status: Former   Smokeless tobacco: Not on file  Substance and Sexual Activity   Alcohol use: Yes   Drug use: No   Sexual activity: Not on file  Other Topics Concern   Not on file  Social History Narrative   Not on file   Social Determinants of Health   Financial Resource Strain: Not on file  Food Insecurity: Not on file  Transportation Needs: Not on file  Physical Activity: Not on file  Stress: Not on file  Social Connections: Not on file     Family History: The patient's family history includes Heart disease in his sister.  ROS:   All other ROS reviewed and negative. Pertinent positives noted in the HPI.     EKGs/Labs/Other Studies Reviewed:   The following studies were personally reviewed by me today:  Recent Labs: No results found for requested labs within last 365 days.   Recent Lipid Panel    Component Value Date/Time   CHOL 120 10/16/2022 0843   TRIG 51 10/16/2022 0843   HDL  44 10/16/2022 0843   CHOLHDL 2.7 10/16/2022 0843   LDLCALC 64 10/16/2022 0843    Physical Exam:   VS:  BP 128/72   Pulse (!) 54   Ht '5\' 6"'$  (1.676 m)   Wt 158 lb 3.2 oz (71.8 kg)   SpO2 99%   BMI 25.53 kg/m    Wt Readings from Last 3 Encounters:  11/19/22 158 lb 3.2 oz (71.8 kg)  06/12/22 158 lb 1.6 oz (71.7 kg)  12/01/15 152 lb 1.9 oz (69 kg)    General: Well nourished, well developed, in no acute distress Head: Atraumatic, normal size  Eyes: PEERLA, EOMI  Neck: Supple, no JVD Endocrine: No thryomegaly Cardiac: Normal S1, S2; RRR; no murmurs, rubs, or gallops Lungs: Clear to auscultation bilaterally, no wheezing, rhonchi or rales  Abd: Soft, nontender, no hepatomegaly  Ext: No edema, pulses 2+ Musculoskeletal: No  deformities, BUE and BLE strength normal and equal Skin: Warm and dry, no rashes   Neuro: Alert and oriented to person, place, time, and situation, CNII-XII grossly intact, no focal deficits  Psych: Normal mood and affect   ASSESSMENT:   Shawn Scott is a 72 y.o. male who presents for the following: 1. 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 Mixed hyperlipidemia     PLAN:   1. 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 Mixed hyperlipidemia -Coronary calcium score 290 which is 63rd percentile.  Was taking Crestor 5 mg daily but reported brain fog.  LDL 64 which is at goal.  We discussed trying to take this medication every other day to see if this helps.  We also discussed switching to Lipitor to see if he notices a difference.  For now he will continue with Crestor and have his labs rechecked by his primary care physician.  We will see how he does.  He will see [place] yearly.  His goal LDL cholesterol is less than 70.  Disposition: Return in about 1 year (around 11/20/2023).  Medication Adjustments/Labs and Tests Ordered: Current medicines are reviewed at length with the patient today.  Concerns regarding medicines are outlined above.  No orders of the defined types were placed in this encounter.  No orders of the defined types were placed in this encounter.   Patient Instructions  Medication Instructions:  STOP Zetia Continue taking Crestor every other day   *If you need a refill on your cardiac medications before your next appointment, please call your pharmacy*   Follow-Up: At [facility], you and your health needs are our priority.  As part of our continuing mission to provide you with exceptional heart care, we have created designated Provider Care Teams.  These Care Teams include your primary Cardiologist (physician) and Advanced Practice Providers (APPs -  Physician Assistants and Nurse Practitioners) who all work together to  provide you with the care you need, when you need it.  We recommend signing up for the patient portal called "MyChart".  Sign up information is provided on this After Visit Summary.  MyChart is used to connect with patients for Virtual Visits (Telemedicine).  Patients are able to view lab/test results, encounter notes, upcoming appointments, etc.  Non-urgent messages can be sent to your provider as well.   To learn more about what you can do with MyChart, go to [email or web address].    Your next appointment:   12 month(s)  Provider:   Eleonore Chiquito, MD or Sande Rives, PA-C, Diona Browner,  NP       Time Spent with Patient: I have spent a total of 25 minutes with patient reviewing hospital notes, telemetry, EKGs, labs and examining the patient as well as establishing an assessment and plan that was discussed with the patient.  > 50% of time was spent in direct patient care.  Signed, Addison Naegeli. Audie Box, MD, [place]  [address], [place], [place] [address] [phone]  11/19/2022 3:39 PM

## 2022-11-19 ENCOUNTER — Encounter: Payer: Self-pay | Admitting: Cardiovascular Disease

## 2022-11-19 ENCOUNTER — Ambulatory Visit: Payer: PPO | Attending: Cardiovascular Disease | Admitting: Cardiovascular Disease

## 2022-11-19 VITALS — BP 128/72 | HR 54 | Ht 66.0 in | Wt 158.2 lb

## 2022-11-19 DIAGNOSIS — R931 Abnormal findings on diagnostic imaging of heart and coronary circulation: Secondary | ICD-10-CM | POA: Diagnosis not present

## 2022-11-19 DIAGNOSIS — E782 Mixed hyperlipidemia: Secondary | ICD-10-CM

## 2022-11-19 NOTE — Patient Instructions (Signed)
Medication Instructions:  STOP Zetia Continue taking Crestor every other day   *If you need a refill on your cardiac medications before your next appointment, please call your pharmacy*   Follow-Up: At [facility], you and your health needs are our priority.  As part of our continuing mission to provide you with exceptional heart care, we have created designated Provider Care Teams.  These Care Teams include your primary Cardiologist (physician) and Advanced Practice Providers (APPs -  Physician Assistants and Nurse Practitioners) who all work together to provide you with the care you need, when you need it.  We recommend signing up for the patient portal called "MyChart".  Sign up information is provided on this After Visit Summary.  MyChart is used to connect with patients for Virtual Visits (Telemedicine).  Patients are able to view lab/test results, encounter notes, upcoming appointments, etc.  Non-urgent messages can be sent to your provider as well.   To learn more about what you can do with MyChart, go to [email or web address].    Your next appointment:   12 month(s)  Provider:   Eleonore Chiquito, MD or Sande Rives, PA-C, Diona Browner, NP

## 2022-12-03 DIAGNOSIS — I77811 Abdominal aortic ectasia: Secondary | ICD-10-CM | POA: Diagnosis not present

## 2022-12-03 DIAGNOSIS — Z125 Encounter for screening for malignant neoplasm of prostate: Secondary | ICD-10-CM | POA: Diagnosis not present

## 2022-12-03 DIAGNOSIS — K4090 Unilateral inguinal hernia, without obstruction or gangrene, not specified as recurrent: Secondary | ICD-10-CM | POA: Diagnosis not present

## 2022-12-03 DIAGNOSIS — Z0001 Encounter for general adult medical examination with abnormal findings: Secondary | ICD-10-CM | POA: Diagnosis not present

## 2022-12-03 DIAGNOSIS — D841 Defects in the complement system: Secondary | ICD-10-CM | POA: Diagnosis not present

## 2022-12-03 DIAGNOSIS — R7309 Other abnormal glucose: Secondary | ICD-10-CM | POA: Diagnosis not present

## 2022-12-03 DIAGNOSIS — E7800 Pure hypercholesterolemia, unspecified: Secondary | ICD-10-CM | POA: Diagnosis not present

## 2022-12-03 DIAGNOSIS — Z79899 Other long term (current) drug therapy: Secondary | ICD-10-CM | POA: Diagnosis not present

## 2022-12-04 DIAGNOSIS — Z0001 Encounter for general adult medical examination with abnormal findings: Secondary | ICD-10-CM | POA: Diagnosis not present

## 2023-04-30 DIAGNOSIS — K4090 Unilateral inguinal hernia, without obstruction or gangrene, not specified as recurrent: Secondary | ICD-10-CM | POA: Diagnosis not present

## 2023-05-06 ENCOUNTER — Encounter: Payer: Self-pay | Admitting: Cardiovascular Disease

## 2023-07-09 ENCOUNTER — Ambulatory Visit: Payer: Self-pay | Admitting: Surgery

## 2023-07-09 DIAGNOSIS — K4090 Unilateral inguinal hernia, without obstruction or gangrene, not specified as recurrent: Secondary | ICD-10-CM | POA: Diagnosis not present

## 2023-07-11 DIAGNOSIS — T63481A Toxic effect of venom of other arthropod, accidental (unintentional), initial encounter: Secondary | ICD-10-CM | POA: Diagnosis not present

## 2023-07-11 DIAGNOSIS — T782XXA Anaphylactic shock, unspecified, initial encounter: Secondary | ICD-10-CM | POA: Diagnosis not present

## 2023-07-11 DIAGNOSIS — D841 Defects in the complement system: Secondary | ICD-10-CM | POA: Diagnosis not present

## 2023-08-06 ENCOUNTER — Encounter (HOSPITAL_BASED_OUTPATIENT_CLINIC_OR_DEPARTMENT_OTHER): Payer: Self-pay | Admitting: Surgery

## 2023-08-07 ENCOUNTER — Encounter (HOSPITAL_BASED_OUTPATIENT_CLINIC_OR_DEPARTMENT_OTHER): Payer: Self-pay | Admitting: Surgery

## 2023-08-07 NOTE — Progress Notes (Signed)
Spoke w/ via phone for pre-op interview--- pt Lab needs dos----    no     Lab results------ no COVID test -----patient states asymptomatic no test needed Arrive at ------- 0730 on 08-15-2023 NPO after MN NO Solid Food.  Clear liquids from MN until--- 0630 Med rec completed Medications to take morning of surgery ----- pepcid, xanax Diabetic medication ----- n/a Patient instructed no nail polish to be worn day of surgery Patient instructed to bring photo id and insurance card day of surgery Patient aware to have Driver (ride ) / caregiver    for 24 hours after surgery - wife, Shawn Scott Patient Special Instructions ----- asked to bring his injection Firazyr with him dos (pt has acute angioedema) to have on hand if needed Pre-Op special Instructions ----- n/a Patient verbalized understanding of instructions that were given at this phone interview. Patient denies chest pain, sob, fever, cough at the interview.

## 2023-08-12 ENCOUNTER — Encounter (HOSPITAL_BASED_OUTPATIENT_CLINIC_OR_DEPARTMENT_OTHER): Payer: Self-pay | Admitting: Surgery

## 2023-08-12 DIAGNOSIS — K4090 Unilateral inguinal hernia, without obstruction or gangrene, not specified as recurrent: Secondary | ICD-10-CM

## 2023-08-12 NOTE — H&P (Signed)
REFERRING PHYSICIAN: Koirala, Dibas, MD  PROVIDER: Ellamarie Naeve Myra Rude, MD   Chief Complaint: New Consultation (Right inguinal hernia)  History of Present Illness:  Patient is referred by his primary care physician, Dr. Darrow Bussing, for surgical evaluation and management of a right inguinal hernia. Patient first noted symptoms during physical activity approximately 2 years ago. This has helped into a visible bulge approximately 1 year ago. Patient has noted gradual enlargement. He has intermittent minor discomfort. He denies any signs or symptoms of obstruction. He has had no prior hernia repairs. He presents today for surgical assessment in anticipation of inguinal hernia repair.  Review of Systems: A complete review of systems was obtained from the patient. I have reviewed this information and discussed as appropriate with the patient. See HPI as well for other ROS.  Review of Systems  Constitutional: Negative.  HENT: Negative.  Eyes: Negative.  Respiratory: Negative.  Cardiovascular: Negative.  Gastrointestinal: Negative.  Genitourinary: Negative.  Musculoskeletal: Negative.  Skin: Negative.  Neurological: Negative.  Endo/Heme/Allergies: Negative.  Psychiatric/Behavioral: Negative.    Medical History: Past Medical History:  Diagnosis Date  Hyperlipidemia   Patient Active Problem List  Diagnosis  Unilateral inguinal hernia without obstruction or gangrene   History reviewed. No pertinent surgical history.   Not on File  Current Outpatient Medications on File Prior to Visit  Medication Sig Dispense Refill  icatibant (FIRAZYR) 30 mg/3 mL injection syringe Inject subcutaneously at bedtime as needed  rosuvastatin (CRESTOR) 5 MG tablet 1 tablet Orally Once a day for 30 day(s)   No current facility-administered medications on file prior to visit.   Family History  Problem Relation Age of Onset  Coronary Artery Disease (Blocked arteries around heart) Sister     Social History   Tobacco Use  Smoking Status Former  Types: Cigarettes  Start date: 2018  Smokeless Tobacco Never    Social History   Socioeconomic History  Marital status: Married  Tobacco Use  Smoking status: Former  Types: Cigarettes  Start date: 2018  Smokeless tobacco: Never  Substance and Sexual Activity  Alcohol use: Yes  Drug use: Never   Objective:   Vitals:  BP: 120/76  Pulse: 63  Temp: 36.8 C (98.3 F)  SpO2: 97%  Weight: 71.5 kg (157 lb 9.6 oz)  Height: 167.6 cm (5\' 6" )  PainSc: 0-No pain   Body mass index is 25.44 kg/m.  Physical Exam   GENERAL APPEARANCE Comfortable, no acute issues Development: normal Gross deformities: none  SKIN Rash, lesions, ulcers: none Induration, erythema: none Nodules: none palpable  EYES Conjunctiva and lids: normal Pupils: equal and reactive  EARS, NOSE, MOUTH, THROAT External ears: no lesion or deformity External nose: no lesion or deformity Hearing: grossly normal  NECK Symmetric: yes Trachea: midline  ABDOMEN Soft without distention. No surgical wounds. No sign of umbilical hernia.  GENITOURINARY/RECTAL Normal male genitalia. Palpation in the left inguinal canal with cough and Valsalva shows no sign of herniation. There is a visible bulge on the right. With manipulation, the hernia is reducible. It augments with cough and Valsalva. There is minor discomfort.  MUSCULOSKELETAL Station and gait: normal Digits and nails: no clubbing or cyanosis Muscle strength: grossly normal all extremities Range of motion: grossly normal all extremities Deformity: none  PSYCHIATRIC Oriented to person, place, and time: yes Mood and affect: normal for situation Judgment and insight: appropriate for situation   Assessment and Plan:   Unilateral inguinal hernia without obstruction or gangrene, recurrence not specified  Patient is referred by his primary care physician for surgical evaluation and  management of a newly diagnosed right inguinal hernia. Patient is provided with written literature on hernia surgery to review at home.  Today we discussed the options for surgical management. We discussed open hernia surgery versus laparoscopic surgery. We discussed the risk and benefits of each. I have recommended open repair with mesh as the procedure with the lowest chance of recurrence. We discussed the size and location of the surgical incision. We discussed doing this as an outpatient surgical procedure. We discussed restrictions on his activities after surgery. We discussed the risk of recurrence. The patient understands and wishes to proceed with surgery in the near future.  Darnell Level, MD [facility] practice Office: [phone]

## 2023-08-12 NOTE — Anesthesia Preprocedure Evaluation (Signed)
Anesthesia Evaluation  Patient identified by MRN, date of birth, ID band Patient awake    Reviewed: Allergy & Precautions, NPO status , Patient's Chart, lab work & pertinent test results  Airway Mallampati: II  TM Distance: >3 FB Neck ROM: Full    Dental  (+) Teeth Intact, Dental Advisory Given   Pulmonary former smoker   Pulmonary exam normal breath sounds clear to auscultation       Cardiovascular hypertension (165/81 preop, no home meds), Normal cardiovascular exam Rhythm:Regular Rate:Normal     Neuro/Psych negative neurological ROS  negative psych ROS   GI/Hepatic Neg liver ROS,GERD  Controlled,,  Endo/Other  negative endocrine ROS    Renal/GU negative Renal ROS  negative genitourinary   Musculoskeletal  (+) Arthritis , Osteoarthritis,    Abdominal   Peds  Hematology negative hematology ROS (+)   Anesthesia Other Findings Right inguinal hernia  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 Unsure of exact triggers, but include any stress or physical trauma. Last use of IM medication was last week, took 1/2 dose. Has never been hospitalized for it.  Reproductive/Obstetrics negative OB ROS                             Anesthesia Physical Anesthesia Plan  ASA: 2  Anesthesia Plan: General   Post-op Pain Management: Tylenol PO (pre-op)*   Induction: Intravenous  PONV Risk Score and Plan: 3 and Ondansetron, Dexamethasone, Midazolam and Treatment may vary due to age or medical condition  Airway Management Planned: Oral ETT and LMA  Additional Equipment: None  Intra-op Plan:   Post-operative Plan: Extubation in OR  Informed Consent: I have reviewed the patients History and Physical, chart, labs and discussed the procedure including the risks, benefits and alternatives for the proposed anesthesia with the patient or authorized representative who has  indicated his/her understanding and acceptance.     Dental advisory given  Plan Discussed with: CRNA  Anesthesia Plan Comments: (D/w Dr. Gerrit Friends preference for LMA to limit airway manipulation/physical trauma d/t hereditary angioedema. Will keep patient's icatibant injection with [place] throughout the perioperative period in case of emergencies. )       Anesthesia Quick Evaluation

## 2023-08-15 ENCOUNTER — Other Ambulatory Visit: Payer: Self-pay

## 2023-08-15 ENCOUNTER — Ambulatory Visit (HOSPITAL_BASED_OUTPATIENT_CLINIC_OR_DEPARTMENT_OTHER): Payer: Self-pay | Admitting: Anesthesiology

## 2023-08-15 ENCOUNTER — Encounter (HOSPITAL_BASED_OUTPATIENT_CLINIC_OR_DEPARTMENT_OTHER): Payer: Self-pay | Admitting: Surgery

## 2023-08-15 ENCOUNTER — Ambulatory Visit (HOSPITAL_BASED_OUTPATIENT_CLINIC_OR_DEPARTMENT_OTHER)
Admission: RE | Admit: 2023-08-15 | Discharge: 2023-08-15 | Disposition: A | Discharge status: Home / Self Care | Payer: PPO | Source: Ambulatory Visit | Attending: Surgery | Admitting: Surgery

## 2023-08-15 ENCOUNTER — Encounter (HOSPITAL_BASED_OUTPATIENT_CLINIC_OR_DEPARTMENT_OTHER)
Admission: RE | Disposition: A | Discharge status: Home / Self Care | Payer: Self-pay | Source: Ambulatory Visit | Attending: Surgery

## 2023-08-15 DIAGNOSIS — Z87891 Personal history of nicotine dependence: Secondary | ICD-10-CM | POA: Insufficient documentation

## 2023-08-15 DIAGNOSIS — Z01818 Encounter for other preprocedural examination: Secondary | ICD-10-CM

## 2023-08-15 DIAGNOSIS — I10 Essential (primary) hypertension: Secondary | ICD-10-CM | POA: Diagnosis not present

## 2023-08-15 DIAGNOSIS — K4090 Unilateral inguinal hernia, without obstruction or gangrene, not specified as recurrent: Secondary | ICD-10-CM

## 2023-08-15 HISTORY — DX: Mixed hyperlipidemia: E78.2

## 2023-08-15 HISTORY — DX: Abnormal findings on diagnostic imaging of heart and coronary circulation: R93.1

## 2023-08-15 HISTORY — DX: Nocturia: R35.1

## 2023-08-15 HISTORY — DX: Unilateral inguinal hernia, without obstruction or gangrene, not specified as recurrent: K40.90

## 2023-08-15 HISTORY — DX: Unspecified osteoarthritis, unspecified site: M19.90

## 2023-08-15 HISTORY — DX: Defects in the complement system: D84.1

## 2023-08-15 HISTORY — DX: Toxic effect of venom of other arthropod, accidental (unintentional), initial encounter: T63.481A

## 2023-08-15 HISTORY — PX: INGUINAL HERNIA REPAIR: SHX194

## 2023-08-15 HISTORY — DX: Anaphylactic shock, unspecified, initial encounter: T78.2XXA

## 2023-08-15 HISTORY — DX: Gastro-esophageal reflux disease without esophagitis: K21.9

## 2023-08-15 SURGERY — REPAIR, HERNIA, INGUINAL, ADULT
Anesthesia: General | Site: Groin | Laterality: Right

## 2023-08-15 MED ORDER — DEXMEDETOMIDINE HCL IN NACL 80 MCG/20ML IV SOLN
INTRAVENOUS | Status: DC | PRN
  Administered 2023-08-15: 8 ug via INTRAVENOUS
  Administered 2023-08-15: 4 ug via INTRAVENOUS

## 2023-08-15 MED ORDER — HYDROMORPHONE HCL 1 MG/ML IJ SOLN
0.2500 mg | INTRAMUSCULAR | Status: DC | PRN
Start: 2023-08-15 — End: 2023-08-15

## 2023-08-15 MED ORDER — ACETAMINOPHEN 500 MG PO TABS
ORAL_TABLET | ORAL | Status: AC
  Filled 2023-08-15: qty 2

## 2023-08-15 MED ORDER — BUPIVACAINE HCL 0.5 % IJ SOLN
INTRAMUSCULAR | Status: DC | PRN
  Administered 2023-08-15: 20 mL

## 2023-08-15 MED ORDER — KETOROLAC TROMETHAMINE 30 MG/ML IJ SOLN
INTRAMUSCULAR | Status: AC
  Filled 2023-08-15: qty 1

## 2023-08-15 MED ORDER — CHLORHEXIDINE GLUCONATE CLOTH 2 % EX PADS: 6.0000 | MEDICATED_PAD | Freq: Once | CUTANEOUS | Status: DC

## 2023-08-15 MED ORDER — MIDAZOLAM HCL 2 MG/2ML IJ SOLN
INTRAMUSCULAR | Status: AC
Start: 2023-08-15 — End: ?
  Filled 2023-08-15: qty 2

## 2023-08-15 MED ORDER — ROCURONIUM BROMIDE 10 MG/ML (PF) SYRINGE
PREFILLED_SYRINGE | INTRAVENOUS | Status: AC
  Filled 2023-08-15: qty 10

## 2023-08-15 MED ORDER — FENTANYL CITRATE (PF) 100 MCG/2ML IJ SOLN
INTRAMUSCULAR | Status: AC
  Filled 2023-08-15: qty 2

## 2023-08-15 MED ORDER — BUPIVACAINE LIPOSOME 1.3 % IJ SUSP
INTRAMUSCULAR | Status: DC | PRN
  Administered 2023-08-15: 20 mL

## 2023-08-15 MED ORDER — ONDANSETRON HCL 4 MG/2ML IJ SOLN
INTRAMUSCULAR | Status: AC
  Filled 2023-08-15: qty 2

## 2023-08-15 MED ORDER — TRAMADOL HCL 50 MG PO TABS: 50.0000 mg | ORAL_TABLET | Freq: Four times a day (QID) | ORAL | 0 refills | Status: AC | PRN

## 2023-08-15 MED ORDER — KETOROLAC TROMETHAMINE 30 MG/ML IJ SOLN: 15.0000 mg | Freq: Once | INTRAMUSCULAR | Status: DC | PRN

## 2023-08-15 MED ORDER — OXYCODONE HCL 5 MG PO TABS: 5.0000 mg | ORAL_TABLET | Freq: Once | ORAL | Status: DC | PRN

## 2023-08-15 MED ORDER — ACETAMINOPHEN 500 MG PO TABS
1000.0000 mg | ORAL_TABLET | Freq: Once | ORAL | Status: AC
  Administered 2023-08-15: 1000 mg via ORAL

## 2023-08-15 MED ORDER — EPHEDRINE SULFATE-NACL 50-0.9 MG/10ML-% IV SOSY
PREFILLED_SYRINGE | INTRAVENOUS | Status: DC | PRN
  Administered 2023-08-15: 5 mg via INTRAVENOUS

## 2023-08-15 MED ORDER — LACTATED RINGERS IV SOLN: INTRAVENOUS | Status: DC

## 2023-08-15 MED ORDER — 0.9 % SODIUM CHLORIDE (POUR BTL) OPTIME
TOPICAL | Status: DC | PRN
  Administered 2023-08-15: 1000 mL

## 2023-08-15 MED ORDER — LIDOCAINE 2% (20 MG/ML) 5 ML SYRINGE
INTRAMUSCULAR | Status: DC | PRN
  Administered 2023-08-15: 80 mg via INTRAVENOUS

## 2023-08-15 MED ORDER — CEFAZOLIN SODIUM-DEXTROSE 2-4 GM/100ML-% IV SOLN
INTRAVENOUS | Status: AC
  Filled 2023-08-15: qty 100

## 2023-08-15 MED ORDER — PROPOFOL 10 MG/ML IV BOLUS
INTRAVENOUS | Status: AC
  Filled 2023-08-15: qty 20

## 2023-08-15 MED ORDER — KETOROLAC TROMETHAMINE 30 MG/ML IJ SOLN
INTRAMUSCULAR | Status: DC | PRN
  Administered 2023-08-15: 15 mg via INTRAVENOUS

## 2023-08-15 MED ORDER — EPHEDRINE 5 MG/ML INJ
INTRAVENOUS | Status: AC
  Filled 2023-08-15: qty 5

## 2023-08-15 MED ORDER — MIDAZOLAM HCL 5 MG/5ML IJ SOLN
INTRAMUSCULAR | Status: DC | PRN
  Administered 2023-08-15: 2 mg via INTRAVENOUS

## 2023-08-15 MED ORDER — SODIUM CHLORIDE 0.9 % IV SOLN: INTRAVENOUS | Status: DC | PRN

## 2023-08-15 MED ORDER — DEXAMETHASONE SODIUM PHOSPHATE 10 MG/ML IJ SOLN
INTRAMUSCULAR | Status: AC
  Filled 2023-08-15: qty 1

## 2023-08-15 MED ORDER — ONDANSETRON HCL 4 MG/2ML IJ SOLN
4.0000 mg | Freq: Once | INTRAMUSCULAR | Status: DC | PRN
Start: 2023-08-15 — End: 2023-08-15

## 2023-08-15 MED ORDER — DEXAMETHASONE SODIUM PHOSPHATE 10 MG/ML IJ SOLN
INTRAMUSCULAR | Status: DC | PRN
  Administered 2023-08-15: 4 mg via INTRAVENOUS

## 2023-08-15 MED ORDER — SODIUM CHLORIDE 0.9 % IV SOLN
INTRAVENOUS | Status: DC
  Administered 2023-08-15: 500 mL via INTRAVENOUS

## 2023-08-15 MED ORDER — ONDANSETRON HCL 4 MG/2ML IJ SOLN
INTRAMUSCULAR | Status: DC | PRN
  Administered 2023-08-15: 4 mg via INTRAVENOUS

## 2023-08-15 MED ORDER — FENTANYL CITRATE (PF) 100 MCG/2ML IJ SOLN
INTRAMUSCULAR | Status: DC | PRN
  Administered 2023-08-15 (×2): 50 ug via INTRAVENOUS

## 2023-08-15 MED ORDER — CEFAZOLIN SODIUM-DEXTROSE 2-4 GM/100ML-% IV SOLN
2.0000 g | INTRAVENOUS | Status: AC
  Administered 2023-08-15: 2 g via INTRAVENOUS

## 2023-08-15 MED ORDER — OXYCODONE HCL 5 MG/5ML PO SOLN
5.0000 mg | Freq: Once | ORAL | Status: DC | PRN
Start: 2023-08-15 — End: 2023-08-15

## 2023-08-15 MED ORDER — PROPOFOL 10 MG/ML IV BOLUS
INTRAVENOUS | Status: DC | PRN
  Administered 2023-08-15: 30 mg via INTRAVENOUS
  Administered 2023-08-15: 150 mg via INTRAVENOUS

## 2023-08-15 MED ORDER — DEXMEDETOMIDINE HCL IN NACL 80 MCG/20ML IV SOLN
INTRAVENOUS | Status: AC
  Filled 2023-08-15: qty 20

## 2023-08-15 MED ORDER — LIDOCAINE HCL (PF) 2 % IJ SOLN
INTRAMUSCULAR | Status: AC
  Filled 2023-08-15: qty 5

## 2023-08-15 MED ORDER — AMISULPRIDE (ANTIEMETIC) 5 MG/2ML IV SOLN: 10.0000 mg | Freq: Once | INTRAVENOUS | Status: DC | PRN

## 2023-08-15 SURGICAL SUPPLY — 40 items
BLADE CLIPPER SENSICLIP SURGIC (BLADE) ×1 IMPLANT
BLADE SURG 15 STRL LF DISP TIS (BLADE) ×1 IMPLANT
CHLORAPREP W/TINT 26 (MISCELLANEOUS) ×1 IMPLANT
COVER BACK TABLE 60X90IN (DRAPES) ×1 IMPLANT
COVER MAYO STAND STRL (DRAPES) ×1 IMPLANT
DERMABOND ADVANCED .7 DNX12 (GAUZE/BANDAGES/DRESSINGS) ×1 IMPLANT
DRAIN PENROSE 0.5X18 (DRAIN) IMPLANT
DRAPE LAPAROTOMY TRNSV 102X78 (DRAPES) ×1 IMPLANT
DRAPE UTILITY XL STRL (DRAPES) ×1 IMPLANT
ELECT REM PT RETURN 9FT ADLT (ELECTROSURGICAL) ×1
ELECTRODE REM PT RTRN 9FT ADLT (ELECTROSURGICAL) ×1 IMPLANT
GAUZE 4X4 16PLY ~~LOC~~+RFID DBL (SPONGE) IMPLANT
GLOVE SURG ORTHO 8.0 STRL STRW (GLOVE) ×1 IMPLANT
GOWN STRL REUS W/TWL XL LVL3 (GOWN DISPOSABLE) ×1 IMPLANT
KIT TURNOVER CYSTO (KITS) ×1 IMPLANT
MANIFOLD NEPTUNE II (INSTRUMENTS) IMPLANT
MESH ULTRAPRO 3X6 7.6X15CM (Mesh General) ×1 IMPLANT
NDL HYPO 25X1 1.5 SAFETY (NEEDLE) ×1 IMPLANT
NEEDLE HYPO 25X1 1.5 SAFETY (NEEDLE) ×1
NS IRRIG 500ML POUR BTL (IV SOLUTION) ×1 IMPLANT
PACK BASIN DAY SURGERY FS (CUSTOM PROCEDURE TRAY) ×1 IMPLANT
PENCIL SMOKE EVACUATOR (MISCELLANEOUS) ×1 IMPLANT
SLEEVE SCD COMPRESS KNEE MED (STOCKING) ×1 IMPLANT
SPIKE FLUID TRANSFER (MISCELLANEOUS) ×1 IMPLANT
SPONGE T-LAP 18X18 ~~LOC~~+RFID (SPONGE) IMPLANT
SPONGE T-LAP 4X18 ~~LOC~~+RFID (SPONGE) IMPLANT
SUT MNCRL AB 4-0 PS2 18 (SUTURE) ×1 IMPLANT
SUT NOVA 0 T19/GS 22DT (SUTURE) IMPLANT
SUT NOVA NAB DX-16 0-1 5-0 T12 (SUTURE) IMPLANT
SUT NOVA NAB GS-21 0 18 T12 DT (SUTURE) IMPLANT
SUT NOVA NAB GS-22 2 0 T19 (SUTURE) ×2 IMPLANT
SUT SILK 2 0 SH (SUTURE) ×1 IMPLANT
SUT SILK 2-0 18XBRD TIE 12 (SUTURE) IMPLANT
SUT VIC AB 3-0 SH 18 (SUTURE) ×1 IMPLANT
SYR BULB EAR ULCER 3OZ GRN STR (SYRINGE) ×1 IMPLANT
SYR CONTROL 10ML LL (SYRINGE) ×1 IMPLANT
TOWEL OR 17X24 6PK STRL BLUE (TOWEL DISPOSABLE) ×1 IMPLANT
TUBE CONNECTING 12X1/4 (SUCTIONS) ×1 IMPLANT
WATER STERILE IRR 500ML POUR (IV SOLUTION) IMPLANT
YANKAUER SUCT BULB TIP NO VENT (SUCTIONS) ×1 IMPLANT

## 2023-08-15 NOTE — Interval H&P Note (Signed)
History and Physical Interval Note:  08/15/2023 9:07 AM  Lestat Schueler  has presented today for surgery, with the diagnosis of RIGHT INGUINAL HERNIA.  The various methods of treatment have been discussed with the patient and family. After consideration of risks, benefits and other options for treatment, the patient has consented to    Procedure(s): OPEN RIGHT INGUINAL HERNIA REPAIR WITH MESH (Right) as a surgical intervention.    The patient's history has been reviewed, patient examined, no change in status, stable for surgery.  I have reviewed the patient's chart and labs.  Questions were answered to the patient's satisfaction.    Darnell Level, MD [facility] practice Office: [phone]   Darnell Level

## 2023-08-15 NOTE — Op Note (Signed)
Procedure Note  Pre-operative Diagnosis:  right inguinal hernia, reducible  Post-operative Diagnosis: same  Procedure:  Open right inguinal hernia repair with mesh  Surgeon:  Darnell Level, MD  Anesthesia:  General  Preparation:  Chlora-prep  Estimated Blood Loss: minimal  Complications:  none  Indications: The patient presented with a right, reducible hernia.  Patient is referred by his primary care physician, Dr. Darrow Bussing, for surgical evaluation and management of a right inguinal hernia. Patient first noted symptoms during physical activity approximately 2 years ago. This has helped into a visible bulge approximately 1 year ago. Patient has noted gradual enlargement. He has intermittent minor discomfort. He denies any signs or symptoms of obstruction. He has had no prior hernia repairs. He presents today for surgical assessment in anticipation of inguinal hernia repair.   Procedure Details  The patient was evaluated in the holding area. All of the patient's questions were answered and the proposed procedure was confirmed. The site of the procedure was properly marked. The patient was taken to the Operating Room, identified by name, and the procedure verified as inguinal hernia repair.  The patient was placed in the supine position and underwent induction of anesthesia. A "Time Out" was performed per routine. The lower abdomen and groin were prepped and draped in the usual aseptic fashion.  After ascertaining that an adequate level of anesthesia had been obtained, an incision was made in the groin with a #10 blade.  Dissection was carried through the subcutaneous tissues and hemostasis obtained with the electrocautery.  A Gelpi retractor was placed for exposure.  The external oblique fascia was incised in line with it's fibers and extended through the external inguinal ring.  The cord structures were dissected out of the inguinal canal and encircled with a Penrose drain.  The floor of the  inguinal canal was dissected out.  There was mild laxity of the floor but no defect was identified.  The cord was explored and a moderate sized hernia sac was dissected out to the level of the internal ring.  The sac was opened.  A high ligation was performed with 2-0 silk suture ligature and the sac was excised and discarded.  The floor of the inguinal canal was reconstructed with Ethicon Ultrapro mesh cut to the appropriate dimensions.  It was secured to the pubic tubercle with a 2-0 Novafil suture and along the inguinal ligament with a running 2-0 Novafil suture.  Mesh was split to accommodate the cord structures.  The superior margin of the mesh was secured to the transversalis and internal oblique musculature with interrupted 2-0 Novafil sutures.  The tails of the mesh were overlapped lateral to the cord structures and secured to the inguinal ligament with interrupted 2-0 Novafil sutures to recreate the internal inguinal ring.  Cord structures were returned to the inguinal canal.  Local anesthetic using a mixture of Exparel and Marcaine was infiltrated throughout the field.  External oblique fascia was closed with interrupted 3-0 Vicryl sutures.  Subcutaneous tissues were closed with interrupted 3-0 Vicryl sutures.  Skin was anesthetized with local anesthetic using a mixture of Exparel and Marcaine, and the skin edges were re-approximated with a running 4-0 Monocryl suture.  Wound was washed and dried and Dermabond was applied.  Instrument, sponge, and needle counts were correct prior to closure and at the conclusion of the case.  The patient tolerated the procedure well.  The patient was awakened from anesthesia and brought to the recovery room in stable condition.  Darnell Level, MD [facility] Surgery Office: [phone]

## 2023-08-15 NOTE — Anesthesia Postprocedure Evaluation (Signed)
Anesthesia Post Note  Patient: Shawn Scott  Procedure(s) Performed: OPEN RIGHT INGUINAL HERNIA REPAIR WITH MESH (Right: Groin)     Patient location during evaluation: PACU Anesthesia Type: General Level of consciousness: awake and alert, oriented and patient cooperative Pain management: pain level controlled Vital Signs Assessment: post-procedure vital signs reviewed and stable Respiratory status: spontaneous breathing, nonlabored ventilation and respiratory function stable Cardiovascular status: blood pressure returned to baseline and stable Postop Assessment: no apparent nausea or vomiting Anesthetic complications: no   No notable events documented.  Last Vitals:  Vitals:   08/15/23 1130 08/15/23 1145  BP: (!) 140/77 132/80  Pulse: 72 70  Resp: 15 15  Temp:    SpO2: 100% 100%    Last Pain:  Vitals:   08/15/23 1130  TempSrc:   PainSc: 0-No pain                 Lannie Fields

## 2023-08-15 NOTE — Transfer of Care (Signed)
Immediate Anesthesia Transfer of Care Note  Patient: Shawn Scott  Procedure(s) Performed: OPEN RIGHT INGUINAL HERNIA REPAIR WITH MESH (Right: Groin)  Patient Location: PACU  Anesthesia Type:General  Level of Consciousness: sedated  Airway & Oxygen Therapy: Patient Spontanous Breathing and Patient connected to nasal cannula oxygen  Post-op Assessment: Report given to RN  Post vital signs: Reviewed and stable  Last Vitals:  Vitals Value Taken Time  BP 143/89 08/15/23 1058  Temp    Pulse 73 08/15/23 1058  Resp 14 08/15/23 1058  SpO2 100 % 08/15/23 1058    Last Pain:  Vitals:   08/15/23 0832  TempSrc: Oral  PainSc: 2       Patients Stated Pain Goal: 6 (08/15/23 3244)  Complications: No notable events documented.

## 2023-08-15 NOTE — Anesthesia Procedure Notes (Signed)
Procedure Name: LMA Insertion Date/Time: 08/15/2023 9:27 AM  Performed by: Briant Sites, CRNAPre-anesthesia Checklist: Patient identified, Emergency Drugs available, Suction available and Patient being monitored Patient Re-evaluated:Patient Re-evaluated prior to induction Oxygen Delivery Method: Circle system utilized Preoxygenation: Pre-oxygenation with 100% oxygen Induction Type: IV induction Ventilation: Mask ventilation without difficulty LMA: LMA inserted LMA Size: 4.0 Number of attempts: 1 Airway Equipment and Method: Bite block Placement Confirmation: positive ETCO2 Tube secured with: Tape Dental Injury: Teeth and Oropharynx as per pre-operative assessment

## 2023-08-15 NOTE — Discharge Instructions (Addendum)
[facility]  HERNIA REPAIR POST OP INSTRUCTIONS  Always review your discharge instruction sheet given to you by the facility where your surgery was performed.  A  prescription for pain medication may be sent to your pharmacy on discharge.  Take your pain medication as prescribed.  If narcotic pain medicine is not needed, then you may take acetaminophen (Tylenol) or ibuprofen (Advil) as needed.  Take your usually prescribed medications unless otherwise directed.  If you need a refill on your pain medication, please contact your pharmacy.  They will contact our office to request authorization. Prescriptions will not be filled after 5:00 PM daily or on weekends.  You should follow a light diet the first 24 hours after arrival home, such as soup and crackers or toast.  Be sure to include plenty of fluids daily.  Resume your normal diet the day after surgery.  Most patients will experience some swelling and bruising around the surgical site.  Ice packs and reclining will help.  Swelling and bruising can take several days to resolve.   It is common to experience some constipation if taking pain medication after surgery.  Increasing fluid intake and taking a stool softener (such as Colace) will usually help or prevent this problem from occurring.  A mild laxative (Milk of Magnesia or Miralax) should be taken according to package directions if there is no bowel movement after 48 hours.  You will likely have Dermabond (topical glue) over your incisions.  This seals the incisions and allows you to bathe and shower at any time after your surgery.  Glue should remain in place for up to 10 days.  It may be removed after 10 days by pealing off the Dermabond material or using Vaseline or naval jelly to remove.  ACTIVITIES:  You may resume regular (light) daily activities beginning the next day - such as daily self-care, walking, climbing stairs - gradually increasing activities as tolerated.  You  may have sexual intercourse when it is comfortable.  Refrain from any heavy lifting or straining until approved by your doctor.  You may drive when you are no longer taking prescription pain medication, when you can comfortably wear a seatbelt, and when you can safely maneuver your car and apply the brakes.  You should see your doctor in the office for a follow-up appointment approximately 2-3 weeks after your surgery.  Make sure that you call for this appointment within a day or two after you arrive home to insure a convenient appointment time.  WHEN TO CALL YOUR DOCTOR: Fever greater than 101.0 Inability to urinate Persistent nausea and/or vomiting Extreme swelling or bruising Continued bleeding from incision Increased pain, redness, or drainage from the incision  The clinic staff is available to answer your questions during regular business hours.  Please don't hesitate to call and ask to speak to one of the nurses for clinical concerns.  If you have a medical emergency, go to the nearest emergency room or call 911.  A surgeon from [facility] is always on call for the hospital.   [facility] [address], [address], [place], [place]  [address]  [phone] ? [phone] ? FAX [phone]   Post Anesthesia Home Care Instructions  Activity: Get plenty of rest for the remainder of the day. A responsible individual must stay with you for 24 hours following the procedure.  For the next 24 hours, DO NOT: -Drive a car -Advertising copywriter -Drink alcoholic beverages -Take any  medication unless instructed by your physician -Make any legal decisions or sign important papers.  Meals: Start with liquid foods such as gelatin or soup. Progress to regular foods as tolerated. Avoid greasy, spicy, heavy foods. If nausea and/or vomiting occur, drink only clear liquids until the nausea and/or vomiting subsides. Call your physician if vomiting  continues.  Special Instructions/Symptoms: Your throat may feel dry or sore from the anesthesia or the breathing tube placed in your throat during surgery. If this causes discomfort, gargle with warm salt water. The discomfort should disappear within 24 hours.  Information for Discharge Teaching: EXPAREL (bupivacaine liposome injectable suspension)   Pain relief is important to your recovery. The goal is to control your pain so you can move easier and return to your normal activities as soon as possible after your procedure. Your physician may use several types of medicines to manage pain, swelling, and more.  Your surgeon or anesthesiologist gave you EXPAREL(bupivacaine) to help control your pain after surgery.  EXPAREL is a local anesthetic designed to release slowly over an extended period of time to provide pain relief by numbing the tissue around the surgical site. EXPAREL is designed to release pain medication over time and can control pain for up to 72 hours. Depending on how you respond to EXPAREL, you may require less pain medication during your recovery. EXPAREL can help reduce or eliminate the need for opioids during the first few days after surgery when pain relief is needed the most. EXPAREL is not an opioid and is not addictive. It does not cause sleepiness or sedation.   Important! A teal colored band has been placed on your arm with the date, time and amount of EXPAREL you have received. Please leave this armband in place for the full 96 hours following administration, and then you may remove the band. If you return to the hospital for any reason within 96 hours following the administration of EXPAREL, the armband provides important information that your health care providers to know, and alerts them that you have received this anesthetic.    Possible side effects of EXPAREL: Temporary loss of sensation or ability to move in the area where medication was injected. Nausea,  vomiting, constipation Rarely, numbness and tingling in your mouth or lips, lightheadedness, or anxiety may occur. Call your doctor right away if you think you may be experiencing any of these sensations, or if you have other questions regarding possible side effects.  Follow all other discharge instructions given to you by your surgeon or nurse. Eat a healthy diet and drink plenty of water or other fluids.  Do not remove green armband before Monday, August 19, 2023. May take Tylenol beginning at 3 PM for soreness/discomfort. May take Ibuprofen beginning at 4 Pm as needed for soreness/discomfort.

## 2023-08-19 ENCOUNTER — Encounter (HOSPITAL_BASED_OUTPATIENT_CLINIC_OR_DEPARTMENT_OTHER): Payer: Self-pay | Admitting: Surgery

## 2023-08-30 DIAGNOSIS — R61 Generalized hyperhidrosis: Secondary | ICD-10-CM | POA: Diagnosis not present

## 2023-08-30 DIAGNOSIS — R630 Anorexia: Secondary | ICD-10-CM | POA: Diagnosis not present

## 2023-12-17 DIAGNOSIS — Z79899 Other long term (current) drug therapy: Secondary | ICD-10-CM | POA: Diagnosis not present

## 2023-12-17 DIAGNOSIS — I77811 Abdominal aortic ectasia: Secondary | ICD-10-CM | POA: Diagnosis not present

## 2023-12-17 DIAGNOSIS — R7309 Other abnormal glucose: Secondary | ICD-10-CM | POA: Diagnosis not present

## 2023-12-17 DIAGNOSIS — Z0001 Encounter for general adult medical examination with abnormal findings: Secondary | ICD-10-CM | POA: Diagnosis not present

## 2023-12-17 DIAGNOSIS — D841 Defects in the complement system: Secondary | ICD-10-CM | POA: Diagnosis not present

## 2023-12-17 DIAGNOSIS — Z125 Encounter for screening for malignant neoplasm of prostate: Secondary | ICD-10-CM | POA: Diagnosis not present

## 2023-12-17 DIAGNOSIS — E7800 Pure hypercholesterolemia, unspecified: Secondary | ICD-10-CM | POA: Diagnosis not present

## 2023-12-19 ENCOUNTER — Other Ambulatory Visit: Payer: Self-pay | Admitting: Family Medicine

## 2023-12-19 DIAGNOSIS — I77811 Abdominal aortic ectasia: Secondary | ICD-10-CM

## 2023-12-20 DIAGNOSIS — Z0001 Encounter for general adult medical examination with abnormal findings: Secondary | ICD-10-CM | POA: Diagnosis not present

## 2024-01-02 ENCOUNTER — Other Ambulatory Visit

## 2024-01-09 ENCOUNTER — Ambulatory Visit
Admission: RE | Admit: 2024-01-09 | Discharge: 2024-01-09 | Disposition: A | Discharge status: Home / Self Care | Source: Ambulatory Visit | Attending: Family Medicine | Admitting: Family Medicine

## 2024-01-09 DIAGNOSIS — I77811 Abdominal aortic ectasia: Secondary | ICD-10-CM | POA: Diagnosis not present
